# Patient Record
Sex: Female | Born: 1990 | Race: Black or African American | Hispanic: No | Marital: Single | Smoking: Former smoker
Health system: Southern US, Community
[De-identification: ages and names within clinical notes are randomized; demographics above are authoritative.]

## PROBLEM LIST (undated history)

## (undated) DIAGNOSIS — D649 Anemia, unspecified: Secondary | ICD-10-CM

## (undated) HISTORY — PX: NO PAST SURGERIES: SHX2092

---
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
## 2008-09-20 ENCOUNTER — Emergency Department: Payer: Self-pay | Admitting: Emergency Medicine

## 2008-09-27 ENCOUNTER — Emergency Department: Payer: Self-pay | Admitting: Emergency Medicine

## 2008-12-22 ENCOUNTER — Emergency Department: Payer: Self-pay | Admitting: Emergency Medicine

## 2009-09-04 ENCOUNTER — Emergency Department: Payer: Self-pay | Admitting: Emergency Medicine

## 2009-10-29 ENCOUNTER — Emergency Department: Payer: Self-pay

## 2010-02-15 ENCOUNTER — Emergency Department: Payer: Self-pay | Admitting: Emergency Medicine

## 2011-03-30 ENCOUNTER — Emergency Department: Payer: Self-pay | Admitting: Emergency Medicine

## 2013-04-14 ENCOUNTER — Emergency Department: Payer: Self-pay | Admitting: Emergency Medicine

## 2013-04-14 LAB — URINALYSIS, COMPLETE
Ketone: NEGATIVE
Leukocyte Esterase: NEGATIVE
Nitrite: NEGATIVE
Ph: 6 (ref 4.5–8.0)
Protein: NEGATIVE
Specific Gravity: 1.02 (ref 1.003–1.030)

## 2013-04-14 LAB — CBC
MCHC: 35.3 g/dL (ref 32.0–36.0)
MCV: 91 fL (ref 80–100)
Platelet: 347 10*3/uL (ref 150–440)
RBC: 4.47 10*6/uL (ref 3.80–5.20)
RDW: 13.3 % (ref 11.5–14.5)
WBC: 9.7 10*3/uL (ref 3.6–11.0)

## 2013-04-21 ENCOUNTER — Emergency Department: Payer: Self-pay | Admitting: Emergency Medicine

## 2013-04-28 ENCOUNTER — Emergency Department: Payer: Self-pay | Admitting: Internal Medicine

## 2013-10-07 ENCOUNTER — Emergency Department: Payer: Self-pay | Admitting: Internal Medicine

## 2013-12-28 ENCOUNTER — Other Ambulatory Visit (HOSPITAL_COMMUNITY): Payer: Self-pay | Admitting: Orthopedic Surgery

## 2013-12-28 ENCOUNTER — Encounter (HOSPITAL_COMMUNITY): Payer: Self-pay

## 2014-01-03 ENCOUNTER — Encounter (HOSPITAL_COMMUNITY): Payer: Self-pay

## 2014-01-03 ENCOUNTER — Encounter (HOSPITAL_COMMUNITY)
Admission: RE | Admit: 2014-01-03 | Discharge: 2014-01-03 | Disposition: A | Discharge status: Home / Self Care | Payer: Worker's Compensation | Source: Ambulatory Visit | Attending: Orthopedic Surgery | Admitting: Orthopedic Surgery

## 2014-01-03 HISTORY — DX: Anemia, unspecified: D64.9

## 2014-01-03 LAB — CBC
HEMATOCRIT: 42 % (ref 36.0–46.0)
Hemoglobin: 14.4 g/dL (ref 12.0–15.0)
MCH: 31.6 pg (ref 26.0–34.0)
MCHC: 34.3 g/dL (ref 30.0–36.0)
MCV: 92.3 fL (ref 78.0–100.0)
Platelets: 371 10*3/uL (ref 150–400)
RBC: 4.55 MIL/uL (ref 3.87–5.11)
RDW: 13 % (ref 11.5–15.5)
WBC: 8.7 10*3/uL (ref 4.0–10.5)

## 2014-01-03 LAB — COMPREHENSIVE METABOLIC PANEL
ALK PHOS: 63 U/L (ref 39–117)
ALT: 20 U/L (ref 0–35)
AST: 19 U/L (ref 0–37)
Albumin: 3.8 g/dL (ref 3.5–5.2)
BUN: 6 mg/dL (ref 6–23)
CHLORIDE: 104 meq/L (ref 96–112)
CO2: 23 mEq/L (ref 19–32)
Calcium: 9.1 mg/dL (ref 8.4–10.5)
Creatinine, Ser: 0.82 mg/dL (ref 0.50–1.10)
GFR calc Af Amer: >90 mL/min (ref >90)
GFR calc non Af Amer: >90 mL/min (ref >90)
Glucose, Bld: 90 mg/dL (ref 70–99)
POTASSIUM: 3.9 meq/L (ref 3.7–5.3)
SODIUM: 141 meq/L (ref 137–147)
TOTAL PROTEIN: 7.2 g/dL (ref 6.0–8.3)
Total Bilirubin: 0.4 mg/dL (ref 0.3–1.2)

## 2014-01-03 LAB — HCG, SERUM, QUALITATIVE: PREG SERUM: NEGATIVE

## 2014-01-03 MED ORDER — CEFAZOLIN SODIUM-DEXTROSE 2-3 GM-% IV SOLR: 2.0000 g | INTRAVENOUS | Status: DC

## 2014-01-03 NOTE — Pre-Procedure Instructions (Signed)
[place] - Preparing for Surgery  Before surgery, you can play an important role.  Because skin is not sterile, your skin needs to be as free of germs as possible.  You can reduce the number of germs on you skin by washing with CHG (chlorahexidine gluconate) soap before surgery.  CHG is an antiseptic cleaner which kills germs and bonds with the skin to continue killing germs even after washing.  Please DO NOT use if you have an allergy to CHG or antibacterial soaps.  If your skin becomes reddened/irritated stop using the CHG and inform your nurse when you arrive at Short Stay.  Do not shave (including legs and underarms) for at least 48 hours prior to the first CHG shower.  You may shave your face.  Please follow these instructions carefully:   1.  Shower with CHG Soap the night before surgery and the morning of Surgery.  2.  If you choose to wash your hair, wash your hair first as usual with your normal shampoo.  3.  After you shampoo, rinse your hair and body thoroughly to remove the shampoo.  4.  Use CHG as you would any other liquid soap.  You can apply CHG directly to the skin and wash gently with scrungie or a clean washcloth.  5.  Apply the CHG Soap to your body ONLY FROM THE NECK DOWN.  Do not use on open wounds or open sores.  Avoid contact with your eyes, ears, mouth and genitals (private parts).  Wash genitals (private parts) with your normal soap.  6.  Wash thoroughly, paying special attention to the area where your surgery will be performed.  7.  Thoroughly rinse your body with warm water from the neck down.  8.  DO NOT shower/wash with your normal soap after using and rinsing off the CHG Soap.  9.  Pat yourself dry with a clean towel.            10.  Wear clean pajamas.            11.  Place clean sheets on your bed the night of your first shower and do not sleep with pets.  Day of Surgery  Do not apply any lotions the morning of surgery.  Please wear clean clothes to the  hospital/surgery center.   

## 2014-01-03 NOTE — Pre-Procedure Instructions (Signed)
Art Buffmelia Filler  01/03/2014   Your procedure is scheduled on:  April 24  [facility] En[facility] "A" [address] at 08:45 AM.  Call this number if you have problems the morning of surgery: [phone]   Remember:   Do not eat food or drink liquids after midnight.   Take these medicines the morning of surgery with A SIP OF WATER: [place]   STOP/ Do not take Aspirin, Aleve, Naproxen, Advil, Ibuprofen, Vitamin, Herbs, or Supplements starting today   Do not wear jewelry, make-up or nail polish.  Do not wear lotions, powders, or perfumes. You may wear deodorant.  Do not shave 48 hours prior to surgery. Men may shave face and neck.  Do not bring valuables to the hospital.  [facility] is not responsible for any belongings or valuables.               Contacts, dentures or bridgework may not be worn into surgery.  Leave suitcase in the car. After surgery it may be brought to your room.  For patients admitted to the hospital, discharge time is determined by your treatment team.               Patients discharged the day of surgery will not be allowed to drive home.  Name and phone number of your driver: Family/ Friend  Special Instructions: See [facility] Pre[facility] For [facility]   Please read over the following fact sheets that you were given: Pain Booklet, Coughing and Deep Breathing and Surgical Site Infection Prevention

## 2014-01-04 ENCOUNTER — Encounter (HOSPITAL_COMMUNITY)
Admission: RE | Disposition: A | Discharge status: Home / Self Care | Payer: Self-pay | Source: Ambulatory Visit | Attending: Orthopedic Surgery

## 2014-01-04 ENCOUNTER — Ambulatory Visit (HOSPITAL_COMMUNITY): Payer: Worker's Compensation | Admitting: Certified Registered"

## 2014-01-04 ENCOUNTER — Ambulatory Visit (HOSPITAL_COMMUNITY)
Admission: RE | Admit: 2014-01-04 | Discharge: 2014-01-05 | Disposition: A | Discharge status: Home / Self Care | Payer: Worker's Compensation | Source: Ambulatory Visit | Attending: Orthopedic Surgery | Admitting: Orthopedic Surgery

## 2014-01-04 ENCOUNTER — Encounter (HOSPITAL_COMMUNITY): Payer: Self-pay | Admitting: *Deleted

## 2014-01-04 ENCOUNTER — Encounter (HOSPITAL_COMMUNITY): Payer: Worker's Compensation | Admitting: Certified Registered"

## 2014-01-04 DIAGNOSIS — S8290XS Unspecified fracture of unspecified lower leg, sequela: Secondary | ICD-10-CM | POA: Insufficient documentation

## 2014-01-04 DIAGNOSIS — X58XXXS Exposure to other specified factors, sequela: Secondary | ICD-10-CM | POA: Insufficient documentation

## 2014-01-04 DIAGNOSIS — Z87891 Personal history of nicotine dependence: Secondary | ICD-10-CM | POA: Insufficient documentation

## 2014-01-04 DIAGNOSIS — IMO0002 Reserved for concepts with insufficient information to code with codable children: Secondary | ICD-10-CM | POA: Diagnosis present

## 2014-01-04 HISTORY — PX: ORIF ANKLE FRACTURE: SHX5408

## 2014-01-04 SURGERY — OPEN REDUCTION INTERNAL FIXATION (ORIF) ANKLE FRACTURE
Anesthesia: Regional | Site: Ankle | Laterality: Right

## 2014-01-04 MED ORDER — FENTANYL CITRATE 0.05 MG/ML IJ SOLN
INTRAMUSCULAR | Status: DC | PRN
  Administered 2014-01-04: 50 ug via INTRAVENOUS
  Administered 2014-01-04: 100 ug via INTRAVENOUS
  Administered 2014-01-04: 50 ug via INTRAVENOUS

## 2014-01-04 MED ORDER — ONDANSETRON HCL 4 MG PO TABS: 4.0000 mg | ORAL_TABLET | Freq: Four times a day (QID) | ORAL | Status: DC | PRN

## 2014-01-04 MED ORDER — ONDANSETRON HCL 4 MG/2ML IJ SOLN: 4.0000 mg | Freq: Four times a day (QID) | INTRAMUSCULAR | Status: DC | PRN

## 2014-01-04 MED ORDER — LACTATED RINGERS IV SOLN
INTRAVENOUS | Status: DC
  Administered 2014-01-04: 09:00:00 via INTRAVENOUS

## 2014-01-04 MED ORDER — PROMETHAZINE HCL 25 MG/ML IJ SOLN: 6.2500 mg | INTRAMUSCULAR | Status: DC | PRN

## 2014-01-04 MED ORDER — ACETAMINOPHEN 160 MG/5ML PO SOLN
325.0000 mg | ORAL | Status: DC | PRN
  Filled 2014-01-04: qty 20.3

## 2014-01-04 MED ORDER — METOCLOPRAMIDE HCL 5 MG/ML IJ SOLN: 5.0000 mg | Freq: Three times a day (TID) | INTRAMUSCULAR | Status: DC | PRN

## 2014-01-04 MED ORDER — 0.9 % SODIUM CHLORIDE (POUR BTL) OPTIME
TOPICAL | Status: DC | PRN
  Administered 2014-01-04: 1000 mL

## 2014-01-04 MED ORDER — DEXTROSE 5 % IV SOLN
500.0000 mg | Freq: Four times a day (QID) | INTRAVENOUS | Status: DC | PRN
  Filled 2014-01-04: qty 5

## 2014-01-04 MED ORDER — CEFAZOLIN SODIUM 1-5 GM-% IV SOLN
1.0000 g | Freq: Four times a day (QID) | INTRAVENOUS | Status: AC
  Administered 2014-01-04 – 2014-01-05 (×3): 1 g via INTRAVENOUS
  Filled 2014-01-04 (×5): qty 50

## 2014-01-04 MED ORDER — MIDAZOLAM HCL 5 MG/5ML IJ SOLN
INTRAMUSCULAR | Status: DC | PRN
  Administered 2014-01-04: 2 mg via INTRAVENOUS

## 2014-01-04 MED ORDER — CEFAZOLIN SODIUM-DEXTROSE 2-3 GM-% IV SOLR
INTRAVENOUS | Status: AC
  Administered 2014-01-04: 2 g via INTRAVENOUS
  Filled 2014-01-04: qty 50

## 2014-01-04 MED ORDER — FENTANYL CITRATE 0.05 MG/ML IJ SOLN
INTRAMUSCULAR | Status: AC
  Filled 2014-01-04: qty 5

## 2014-01-04 MED ORDER — ONDANSETRON HCL 4 MG/2ML IJ SOLN
INTRAMUSCULAR | Status: DC | PRN
  Administered 2014-01-04: 4 mg via INTRAVENOUS

## 2014-01-04 MED ORDER — ASPIRIN EC 325 MG PO TBEC
325.0000 mg | DELAYED_RELEASE_TABLET | Freq: Every day | ORAL | Status: DC
  Administered 2014-01-04 – 2014-01-05 (×2): 325 mg via ORAL
  Filled 2014-01-04 (×2): qty 1

## 2014-01-04 MED ORDER — HYDROMORPHONE HCL PF 1 MG/ML IJ SOLN: 0.2500 mg | INTRAMUSCULAR | Status: DC | PRN

## 2014-01-04 MED ORDER — METHOCARBAMOL 500 MG PO TABS
500.0000 mg | ORAL_TABLET | Freq: Four times a day (QID) | ORAL | Status: DC | PRN
  Administered 2014-01-05 (×3): 500 mg via ORAL
  Filled 2014-01-04 (×3): qty 1

## 2014-01-04 MED ORDER — ESTRADIOL VALERATE-DIENOGEST 3/2-2/2-3/1 MG PO TABS: 1.0000 | ORAL_TABLET | Freq: Every day | ORAL | Status: DC

## 2014-01-04 MED ORDER — OXYCODONE-ACETAMINOPHEN 5-325 MG PO TABS
1.0000 | ORAL_TABLET | ORAL | Status: DC | PRN
  Administered 2014-01-04 – 2014-01-05 (×5): 2 via ORAL
  Filled 2014-01-04 (×5): qty 2

## 2014-01-04 MED ORDER — METOCLOPRAMIDE HCL 10 MG PO TABS: 5.0000 mg | ORAL_TABLET | Freq: Three times a day (TID) | ORAL | Status: DC | PRN

## 2014-01-04 MED ORDER — DEXAMETHASONE SODIUM PHOSPHATE 10 MG/ML IJ SOLN
INTRAMUSCULAR | Status: DC | PRN
  Administered 2014-01-04: 4 mg via INTRAVENOUS

## 2014-01-04 MED ORDER — FENTANYL CITRATE 0.05 MG/ML IJ SOLN
INTRAMUSCULAR | Status: AC
  Filled 2014-01-04: qty 2

## 2014-01-04 MED ORDER — BUPIVACAINE-EPINEPHRINE PF 0.5-1:200000 % IJ SOLN
INTRAMUSCULAR | Status: DC | PRN
  Administered 2014-01-04: 10 mL via PERINEURAL

## 2014-01-04 MED ORDER — LACTATED RINGERS IV SOLN
INTRAVENOUS | Status: DC | PRN
  Administered 2014-01-04: 10:00:00 via INTRAVENOUS

## 2014-01-04 MED ORDER — HYDROMORPHONE HCL PF 1 MG/ML IJ SOLN: 0.5000 mg | INTRAMUSCULAR | Status: DC | PRN

## 2014-01-04 MED ORDER — OXYCODONE-ACETAMINOPHEN 5-325 MG PO TABS: 1.0000 | ORAL_TABLET | ORAL | Status: DC | PRN

## 2014-01-04 MED ORDER — MIDAZOLAM HCL 2 MG/2ML IJ SOLN
INTRAMUSCULAR | Status: AC
  Filled 2014-01-04: qty 2

## 2014-01-04 MED ORDER — PROPOFOL 10 MG/ML IV BOLUS
INTRAVENOUS | Status: AC
  Filled 2014-01-04: qty 20

## 2014-01-04 MED ORDER — LIDOCAINE HCL (CARDIAC) 20 MG/ML IV SOLN
INTRAVENOUS | Status: DC | PRN
  Administered 2014-01-04: 80 mg via INTRAVENOUS

## 2014-01-04 MED ORDER — ACETAMINOPHEN 325 MG PO TABS: 325.0000 mg | ORAL_TABLET | ORAL | Status: DC | PRN

## 2014-01-04 MED ORDER — KETOROLAC TROMETHAMINE 30 MG/ML IJ SOLN: 15.0000 mg | Freq: Once | INTRAMUSCULAR | Status: DC | PRN

## 2014-01-04 MED ORDER — ROPIVACAINE HCL 5 MG/ML IJ SOLN
INTRAMUSCULAR | Status: DC | PRN
  Administered 2014-01-04: 30 mL via PERINEURAL

## 2014-01-04 MED ORDER — PROPOFOL 10 MG/ML IV BOLUS
INTRAVENOUS | Status: DC | PRN
  Administered 2014-01-04: 200 mg via INTRAVENOUS

## 2014-01-04 MED ORDER — OXYCODONE HCL 5 MG PO TABS: 5.0000 mg | ORAL_TABLET | Freq: Once | ORAL | Status: DC | PRN

## 2014-01-04 MED ORDER — OXYCODONE HCL 5 MG/5ML PO SOLN: 5.0000 mg | Freq: Once | ORAL | Status: DC | PRN

## 2014-01-04 SURGICAL SUPPLY — 51 items
BANDAGE ESMARK 6X9 LF (GAUZE/BANDAGES/DRESSINGS) IMPLANT
BIT DRILL 2.5X110 QC LCP DISP (BIT) ×3 IMPLANT
BNDG COHESIVE 4X5 TAN STRL (GAUZE/BANDAGES/DRESSINGS) ×3 IMPLANT
BNDG ESMARK 6X9 LF (GAUZE/BANDAGES/DRESSINGS)
BNDG GAUZE STRTCH 6 (GAUZE/BANDAGES/DRESSINGS) ×3 IMPLANT
COVER SURGICAL LIGHT HANDLE (MISCELLANEOUS) ×3 IMPLANT
CUFF TOURNIQUET SINGLE 34IN LL (TOURNIQUET CUFF) IMPLANT
CUFF TOURNIQUET SINGLE 44IN (TOURNIQUET CUFF) IMPLANT
DRAPE C-ARM MINI 42X72 WSTRAPS (DRAPES) ×3 IMPLANT
DRAPE INCISE IOBAN 66X45 STRL (DRAPES) ×3 IMPLANT
DRAPE PROXIMA HALF (DRAPES) ×3 IMPLANT
DRAPE U-SHAPE 47X51 STRL (DRAPES) ×3 IMPLANT
DRSG ADAPTIC 3X8 NADH LF (GAUZE/BANDAGES/DRESSINGS) ×3 IMPLANT
DURAPREP 26ML APPLICATOR (WOUND CARE) ×3 IMPLANT
ELECT CAUTERY BLADE 6.4 (BLADE) ×3 IMPLANT
ELECT REM PT RETURN 9FT ADLT (ELECTROSURGICAL) ×3
ELECTRODE REM PT RTRN 9FT ADLT (ELECTROSURGICAL) ×1 IMPLANT
GLOVE BIO SURGEON STRL SZ7.5 (GLOVE) ×3 IMPLANT
GLOVE BIOGEL PI IND STRL 7.0 (GLOVE) ×1 IMPLANT
GLOVE BIOGEL PI IND STRL 7.5 (GLOVE) ×2 IMPLANT
GLOVE BIOGEL PI IND STRL 9 (GLOVE) ×1 IMPLANT
GLOVE BIOGEL PI INDICATOR 7.0 (GLOVE) ×2
GLOVE BIOGEL PI INDICATOR 7.5 (GLOVE) ×4
GLOVE BIOGEL PI INDICATOR 9 (GLOVE) ×2
GLOVE ECLIPSE 7.0 STRL STRAW (GLOVE) ×3 IMPLANT
GLOVE SS BIOGEL STRL SZ 6.5 (GLOVE) ×1 IMPLANT
GLOVE SUPERSENSE BIOGEL SZ 6.5 (GLOVE) ×2
GLOVE SURG ORTHO 9.0 STRL STRW (GLOVE) ×3 IMPLANT
GOWN STRL REUS W/ TWL XL LVL3 (GOWN DISPOSABLE) ×1 IMPLANT
GOWN STRL REUS W/TWL XL LVL3 (GOWN DISPOSABLE) ×2
KIT BASIN OR (CUSTOM PROCEDURE TRAY) ×3 IMPLANT
KIT ROOM TURNOVER OR (KITS) ×3 IMPLANT
MANIFOLD NEPTUNE II (INSTRUMENTS) ×3 IMPLANT
NS IRRIG 1000ML POUR BTL (IV SOLUTION) ×3 IMPLANT
PACK ORTHO EXTREMITY (CUSTOM PROCEDURE TRAY) ×3 IMPLANT
PAD ABD 8X10 STRL (GAUZE/BANDAGES/DRESSINGS) ×6 IMPLANT
PAD ARMBOARD 7.5X6 YLW CONV (MISCELLANEOUS) ×6 IMPLANT
PLATE LCP 3.5 1/3 TUB 6HX69 (Plate) ×3 IMPLANT
SCREW CORTEX 3.5 12MM (Screw) ×2 IMPLANT
SCREW CORTEX 3.5 14MM (Screw) ×4 IMPLANT
SCREW CORTEX 3.5 18MM (Screw) ×6 IMPLANT
SCREW LOCK CORT ST 3.5X12 (Screw) ×1 IMPLANT
SCREW LOCK CORT ST 3.5X14 (Screw) ×2 IMPLANT
SCREW LOCK CORT ST 3.5X18 (Screw) ×3 IMPLANT
SPONGE GAUZE 4X4 12PLY (GAUZE/BANDAGES/DRESSINGS) ×3 IMPLANT
SPONGE LAP 18X18 X RAY DECT (DISPOSABLE) ×3 IMPLANT
SUCTION FRAZIER TIP 10 FR DISP (SUCTIONS) ×3 IMPLANT
SUT VIC AB 2-0 CTB1 (SUTURE) ×6 IMPLANT
TOWEL OR 17X26 10 PK STRL BLUE (TOWEL DISPOSABLE) ×3 IMPLANT
TUBE CONNECTING 12'X1/4 (SUCTIONS) ×1
TUBE CONNECTING 12X1/4 (SUCTIONS) ×2 IMPLANT

## 2014-01-04 NOTE — Transfer of Care (Signed)
Immediate Anesthesia Transfer of Care Note  Patient: Morgan Lopez  Procedure(s) Performed: Procedure(s) with comments: OPEN REDUCTION INTERNAL FIXATION (ORIF) ANKLE FRACTURE (Right) - Open Reduction Internal Fixation Right Fibula Fracture and Take Down Non-Union   Patient Location: PACU  Anesthesia Type:General  Level of Consciousness: awake, alert  and oriented  Airway & Oxygen Therapy: Patient connected to face mask oxygen  Post-op Assessment: Report given to PACU RN  Post vital signs: stable  Complications: No apparent anesthesia complications

## 2014-01-04 NOTE — H&P (Signed)
Morgan Lopez is an 24 y.o. female.   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  Past Medical History  Diagnosis Date  . Anemia     Past Surgical History  Procedure Laterality Date  . No past surgeries      No family history on file. Social History:  reports that she quit smoking about 6 weeks ago. She has never used smokeless tobacco. She reports that she does not drink alcohol or use illicit drugs.  Allergies: Not on File  No prescriptions prior to admission    Results for orders placed during the hospital encounter of 01/03/14 (from the past 48 hour(s))  CBC     Status: None   Collection Time    01/03/14 10:30 AM      Result Value Ref Range   WBC 8.7  4.0 - 10.5 K/uL   RBC 4.55  3.87 - 5.11 MIL/uL   Hemoglobin 14.4  12.0 - 15.0 g/dL   HCT 42.0  36.0 - 46.0 %   MCV 92.3  78.0 - 100.0 fL   MCH 31.6  26.0 - 34.0 pg   MCHC 34.3  30.0 - 36.0 g/dL   RDW 13.0  11.5 - 15.5 %   Platelets 371  150 - 400 K/uL  COMPREHENSIVE METABOLIC PANEL     Status: None   Collection Time    01/03/14 10:30 AM      Result Value Ref Range   Sodium 141  137 - 147 mEq/L   Potassium 3.9  3.7 - 5.3 mEq/L   Chloride 104  96 - 112 mEq/L   CO2 23  19 - 32 mEq/L   Glucose, Bld 90  70 - 99 mg/dL   BUN 6  6 - 23 mg/dL   Creatinine, Ser 0.82  0.50 - 1.10 mg/dL   Calcium 9.1  8.4 - 10.5 mg/dL   Total Protein 7.2  6.0 - 8.3 g/dL   Albumin 3.8  3.5 - 5.2 g/dL   AST 19  0 - 37 U/L   ALT 20  0 - 35 U/L   Alkaline Phosphatase 63  39 - 117 U/L   Total Bilirubin 0.4  0.3 - 1.2 mg/dL   GFR calc non Af Amer >90  >90 mL/min   GFR calc Af Amer >90  >90 mL/min   Comment: (NOTE)     The eGFR has been calculated using the CKD EPI equation.     This calculation has not been validated in all clinical situations.     eGFR's persistently <90 mL/min signify possible Chronic Kidney     Disease.  HCG, SERUM, QUALITATIVE      Status: None   Collection Time    01/03/14 10:30 AM      Result Value Ref Range   Preg, Serum NEGATIVE  NEGATIVE   Comment:            THE SENSITIVITY OF THIS     METHODOLOGY IS >10 mIU/mL.   No results found.  Review of Systems  All other systems reviewed and are negative.   There were no vitals taken for this visit. Physical Exam  On examination Morgan Lopez has palpable pulses. She has pain to palpation over the fibula and CT scan shows a nonunion. 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  Plan: Will plan to take down the nonunion open reduction internal fixation. Risks  and benefits were discussed including infection neurovascular injury pain persistent nonunion. Morgan Lopez states she understands and wished to proceed at this time.  Newt Minion 01/04/2014, 6:29 AM

## 2014-01-04 NOTE — Anesthesia Preprocedure Evaluation (Addendum)
Anesthesia Evaluation  Patient identified by MRN, date of birth, ID band Patient awake    Reviewed: Allergy & Precautions, H&P , NPO status , Patient's Chart, lab work & pertinent test results, reviewed documented beta blocker date and time   History of Anesthesia Complications Negative for: history of anesthetic complications  Airway Mallampati: II TM Distance: >3 FB Neck ROM: Full    Dental  (+) Teeth Intact   Pulmonary neg sleep apnea, neg COPDformer smoker,  breath sounds clear to auscultation        Cardiovascular negative cardio ROS  Rhythm:Regular     Neuro/Psych negative neurological ROS  negative psych ROS   GI/Hepatic negative GI ROS, Neg liver ROS,   Endo/Other  negative endocrine ROS  Renal/GU negative Renal ROS     Musculoskeletal negative musculoskeletal ROS (+)   Abdominal   Peds  Hematology negative hematology ROS (+)   Anesthesia Other Findings   Reproductive/Obstetrics Birth control pills                        Anesthesia Physical Anesthesia Plan  ASA: I  Anesthesia Plan: General and Regional   Post-op Pain Management:    Induction: Intravenous  Airway Management Planned: LMA  Additional Equipment: None  Intra-op Plan:   Post-operative Plan: Extubation in OR  Informed Consent: I have reviewed the patients History and Physical, chart, labs and discussed the procedure including the risks, benefits and alternatives for the proposed anesthesia with the patient or authorized representative who has indicated his/her understanding and acceptance.   Dental advisory given  Plan Discussed with: CRNA and Surgeon  Anesthesia Plan Comments:         Anesthesia Quick Evaluation

## 2014-01-04 NOTE — Anesthesia Postprocedure Evaluation (Signed)
  Anesthesia Post-op Note  Patient: Morgan Lopez  Procedure(s) Performed: Procedure(s) with comments: OPEN REDUCTION INTERNAL FIXATION (ORIF) ANKLE FRACTURE (Right) - Open Reduction Internal Fixation Right Fibula Fracture and Take Down Non-Union   Patient Location: PACU  Anesthesia Type:General and Regional  Level of Consciousness: awake, alert  and oriented  Airway and Oxygen Therapy: Patient Spontanous Breathing  Post-op Pain: none  Post-op Assessment: Post-op Vital signs reviewed, Patient's Cardiovascular Status Stable, Respiratory Function Stable, Patent Airway, No signs of Nausea or vomiting and Pain level controlled  Post-op Vital Signs: Reviewed and stable  Last Vitals:  Filed Vitals:   01/04/14 1215  BP: 133/81  Pulse:   Temp:   Resp:     Complications: No apparent anesthesia complications

## 2014-01-04 NOTE — Progress Notes (Signed)
Orthopedic Tech Progress Note Patient Details:  Morgan Lopez 1991/01/10 [id number]  Ortho Devices Type of Ortho Device: CAM walker Ortho Device/Splint Interventions: Application   Mickie BailJennifer Carol Cammer 01/04/2014, 2:22 PM

## 2014-01-04 NOTE — Anesthesia Procedure Notes (Addendum)
Anesthesia Regional Block:  Popliteal block  Pre-Anesthetic Checklist: ,, timeout performed, Correct Patient, Correct Site, Correct Laterality, Correct Procedure, Correct Position, site marked, Risks and benefits discussed,  Surgical consent,  Pre-op evaluation,  At surgeon's request and post-op pain management  Laterality: Lower and Right  Prep: chloraprep       Needles:  Injection technique: Single-shot  Needle Type: Echogenic Stimulator Needle          Additional Needles:  Procedures: ultrasound guided (picture in chart) Popliteal block  Nerve Stimulator or Paresthesia:  Response: eversion, 0.4 mA,   Additional Responses:   Narrative:  Start time: 01/04/2014 10:14 AM End time: 01/04/2014 10:25 AM Injection made incrementally with aspirations every 5 mL.  Performed by: Personally  Anesthesiologist: Moser  Additional Notes: H+P and labs reviewed, risks and benefits discussed with patient, procedure tolerated well without complications. Right popliteal ss block with US guidance performed as well    Procedure Name: LMA Insertion Date/Time: 01/04/2014 11:01 AM Performed by: Ellin GoodieWEAVER, Allexis Bordenave M Pre-anesthesia Checklist: Patient identified, Emergency Drugs available, Suction available, Patient being monitored and Timeout performed Patient Re-evaluated:Patient Re-evaluated prior to inductionOxygen Delivery Method: Circle system utilized Preoxygenation: Pre-oxygenation with 100% oxygen Intubation Type: IV induction Ventilation: Mask ventilation without difficulty LMA: LMA inserted LMA Size: 4.0 Number of attempts: 1 Placement Confirmation: breath sounds checked- equal and bilateral and positive ETCO2 Tube secured with: Tape Dental Injury: Teeth and Oropharynx as per pre-operative assessment  Comments: Easy induction and LMA insertion.  Dr Maple HudsonMoser verified placement.  Carlynn HeraldH Weaver, CRNA

## 2014-01-04 NOTE — Op Note (Signed)
OPERATIVE REPORT  DATE OF SURGERY: 01/04/2014  PATIENT:  Morgan Lopez,  23 y.o. 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 She has failed over 7 months of conservative care and presents at this time for take down of the malunion. Risks and benefits were discussed including infection neurovascular injury persistent pain need for additional surgery. Patient states she understands and wished to proceed at this time. Description of procedure patient was brought to the operating room and underwent a general anesthetic. After adequate levels and anesthesia were obtained patient's right lower extremity was prepped using DuraPrep draped into a sterile field. A timeout was called. A lateral incision was made over the fibula. This was carried sharply down to bone. The nonunion site was identified this wasn't debrided with osteotomes back to bleeding viable cancellus bone. There is no signs of infection. After debridement a one third tubular plate was placed laterally and secured with compression screws. C-arm fluoroscopy verified alignment and reduction. The wound is irrigated with normal saline subcutaneous is closed using 0 Vicryl skin was closed using staples. The wound was covered with a sterile dressing with compression Coban wrap patient was extubated taken to the PACU in stable condition plan for overnight observation.  PLAN OF CARE: Admit for overnight observation  PATIENT DISPOSITION:  PACU - hemodynamically stable.   Nadara MustardMarcus V Antavius Sperbeck, MD 01/04/2014 11:33 AM

## 2014-01-05 NOTE — Care Management Note (Signed)
CARE MANAGEMENT NOTE 01/05/2014  Patient:  Art BuffBURKE,Candis   Account Number:  [id number]  Date Initiated:  01/05/2014  Documentation initiated by:  Vance PeperBRADY,Farrin Shadle  Subjective/Objective Assessment:   23 yr old female admitted with non union of right ankle fracture,s/p ORIF of right ankle.     Action/Plan:   Case manager spoke with patient concerning DME needs. Patient is under worker's comp. Adjuster is Maximino SarinMichael Malone [phone]. CM called and left message, will fax order on Monday.   Anticipated DC Date:  01/05/2014   Anticipated DC Plan:  HOME/SELF CARE      DC Planning Services  CM consult      PAC Choice  DURABLE MEDICAL EQUIPMENT   Choice offered to / List presented to:     DME arranged  [employer]      DME agency  Other     HH arranged  NA      Status of service:  In process, will continue to follow Medicare Important Message given?   (If response is "NO", the following Medicare IM given date fields will be blank) Date Medicare IM given:   Date Additional Medicare IM given:    Discharge Disposition:  HOME/SELF CARE

## 2014-01-05 NOTE — Progress Notes (Signed)
Call to Dr Lajoyce Cornersuda or associate re; poor pain control with robaxin q6h and 2 percocet q3h

## 2014-01-05 NOTE — Evaluation (Signed)
Physical Therapy Evaluation Patient Details Name: Merryn Thaker MRN: [id number] DOB: 21-Jun-1991 Today's Date: 01/05/2014   History of Present Illness  Pt presents for ORIF right ankle after malunion of previous fracture.  Clinical Impression  Pt mobilizing well, felt safer with TDWB with RW than crutches, recommend for home. Recommend OP PT when appropriate. PT signing off at this time for d/c, pt safe with mobility with RW.     Follow Up Recommendations Outpatient PT    Equipment Recommendations  Rolling walker with 5" wheels    Recommendations for Other Services       Precautions / Restrictions Precautions Precautions: None Required Braces or Orthoses: Other Brace/Splint Other Brace/Splint: right CAM boot Restrictions Weight Bearing Restrictions: Yes RLE Weight Bearing: Touchdown weight bearing      Mobility  Bed Mobility Overal bed mobility: Independent                Transfers Overall transfer level: Modified independent Equipment used: Rolling walker (2 wheeled)             General transfer comment: safe standing and sitting to RW  Ambulation/Gait Ambulation/Gait assistance: Modified independent (Device/Increase time) Ambulation Distance (Feet): 20 Feet Assistive device: Rolling walker (2 wheeled) Gait Pattern/deviations: Step-to pattern Gait velocity: decreased   General Gait Details: pt felt safer and more steady with RW than she has with crutches. Recommend for home  Stairs            Wheelchair Mobility    Modified Rankin (Stroke Patients Only)       Balance Overall balance assessment: Needs assistance         Standing balance support: No upper extremity supported;During functional activity Standing balance-Leahy Scale: Fair Standing balance comment: unsteady when keeping TDWB and no UE support                             Pertinent Vitals/Pain VSS    Home Living Family/patient expects to be discharged to::  Private residence Living Arrangements: Children;Other relatives Available Help at Discharge: Family;Available 24 hours/day Type of Home: House Home Access: Stairs to enter   Technical brewer of Steps: 3 Home Layout: One level Home Equipment: Crutches Additional Comments: pt lives with her 60 yo son and her grandmother    Prior Function Level of Independence: Independent         Comments: pt was independent except for driving due to pain and dysfunction that continued at thr right ankle as well as right hip and back from initial injury     Hand Dominance        Extremity/Trunk Assessment   Upper Extremity Assessment: Overall WFL for tasks assessed           Lower Extremity Assessment: RLE deficits/detail RLE Deficits / Details: no ROM right ankle due to CAM boot, painful right hip, knee WFL    Cervical / Trunk Assessment: Normal  Communication   Communication: No difficulties  Cognition Arousal/Alertness: Awake/alert Behavior During Therapy: WFL for tasks assessed/performed Overall Cognitive Status: Within Functional Limits for tasks assessed                      General Comments      Exercises        Assessment/Plan    PT Assessment All further PT needs can be met in the next venue of care  PT Diagnosis Difficulty walking;Acute pain   PT Problem List  Decreased balance;Decreased mobility;Decreased knowledge of use of DME;Decreased knowledge of precautions;Pain  PT Treatment Interventions     PT Goals (Current goals can be found in the Care Plan section) Acute Rehab PT Goals Patient Stated Goal: return home PT Goal Formulation: No goals set, d/c therapy    Frequency     Barriers to discharge        Co-evaluation               End of Session Equipment Utilized During Treatment: Gait belt Activity Tolerance: Patient tolerated treatment well Patient left: in chair;with call bell/phone within reach Nurse Communication: Mobility  status    Functional Assessment Tool Used: clinical judgement Functional Limitation: Mobility: Walking and moving around Mobility: Walking and Moving Around Current Status (I2202): At least 1 percent but less than 20 percent impaired, limited or restricted Mobility: Walking and Moving Around Goal Status [phone]): At least 1 percent but less than 20 percent impaired, limited or restricted Mobility: Walking and Moving Around Discharge Status [phone]): At least 1 percent but less than 20 percent impaired, limited or restricted    Time: 1121-1140 PT Time Calculation (min): 19 min   Charges:   PT Evaluation $Initial PT Evaluation Tier I: 1 Procedure PT Treatments $Gait Training: 8-22 mins   PT G Codes:   Functional Assessment Tool Used: clinical judgement Functional Limitation: Mobility: Walking and moving around  [employer], PT  Acute Rehab Services  [place] 01/05/2014, 1:11 PM

## 2014-01-05 NOTE — Progress Notes (Signed)
Written and verbal d/c instructions provided to patient and family. Prescription given for narcotic analgesic. Pt to call Dr Lajoyce Cornersuda this Monday for concerns r/t need for crutches( PT clear for safer use with walker), and robaxin prescription. D/c to home in wheelchair with staff escort to car.

## 2014-01-07 NOTE — Discharge Summary (Signed)
  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 Takedown nonunion and open reduction internal fixation of the fracture. Discharge to home in stable condition. Followup in the office in 2 weeks.

## 2014-01-08 ENCOUNTER — Encounter (HOSPITAL_COMMUNITY): Payer: Self-pay | Admitting: Orthopedic Surgery

## 2014-09-12 IMAGING — US US PELV - US TRANSVAGINAL
2 series · 14 of 25 positions shown · non-contrast
Comparison: none

REASON FOR EXAM: vaginal bleeding pelvic pain
COMMENTS:

PROCEDURE:     US  - US PELVIS EXAM W/TRANSVAGINAL  - April 14, 2013  [DATE]
RESULT:     Pelvic ultrasound dated 04/14/2013.
TECHNIQUE: Endovaginal and transabdominal imaging of the pelvis was obtained.

[Series 1: us pelv - us transvaginal · 0.25mm/px · 13 of 57 slices shown (1 of 2)]
[im 1/57]
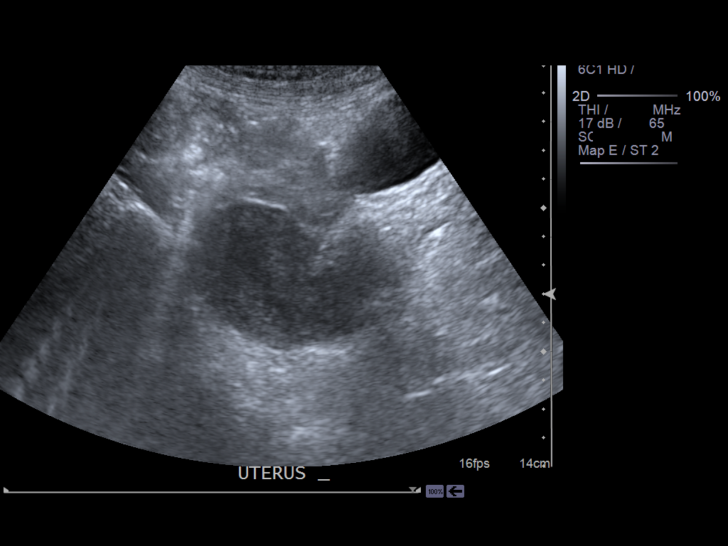
[im 5/57]
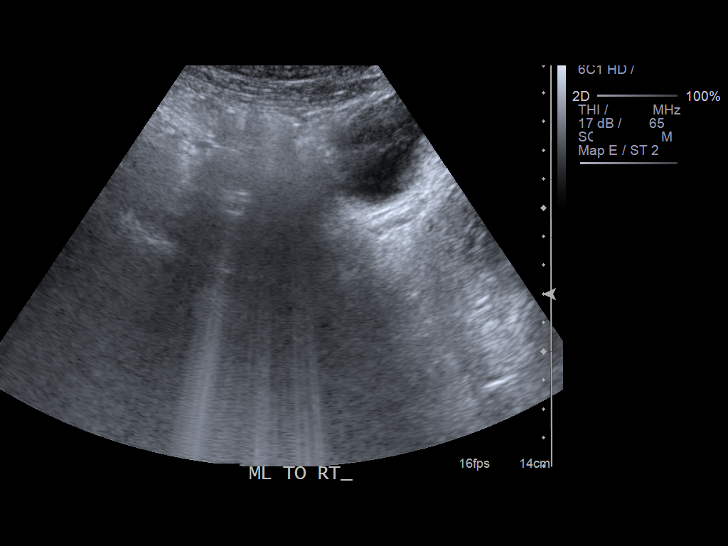
[im 10/57]
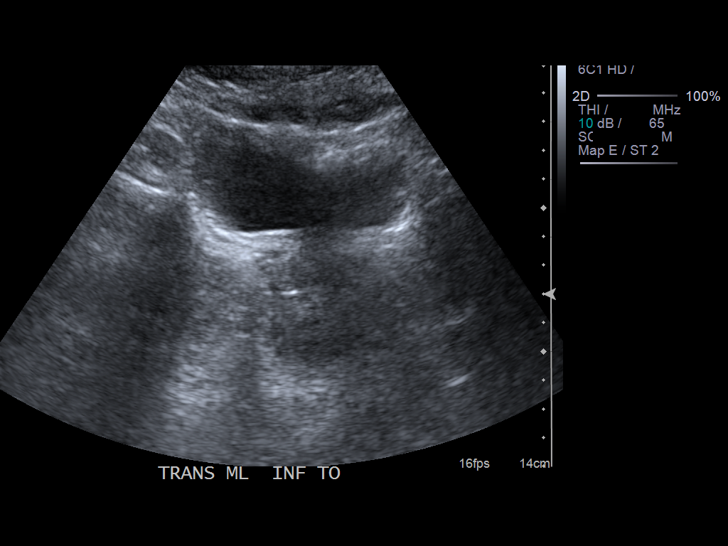
[im 15/57]
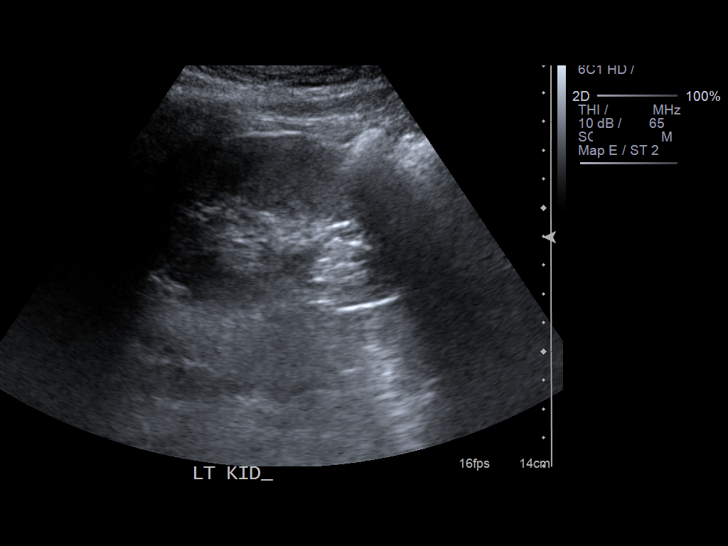
[im 20/57]
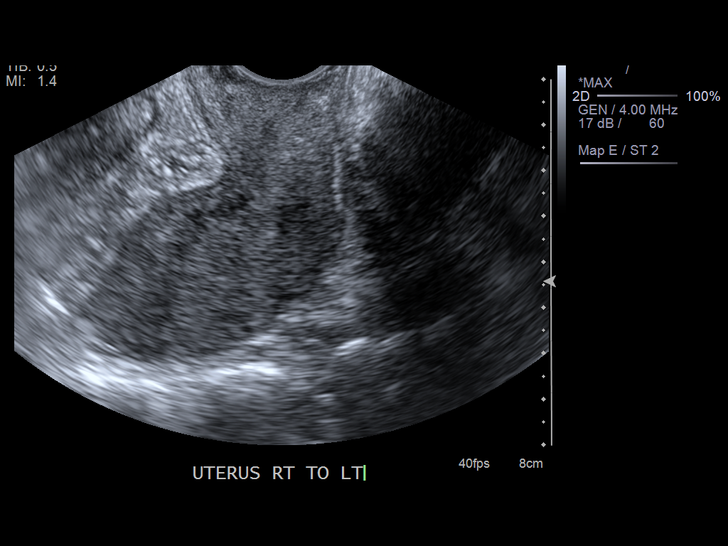
[im 22/57]
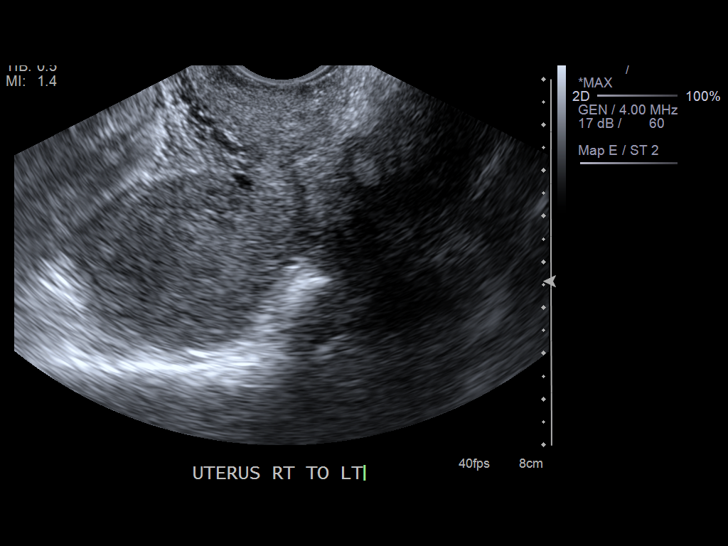
[im 27/57]
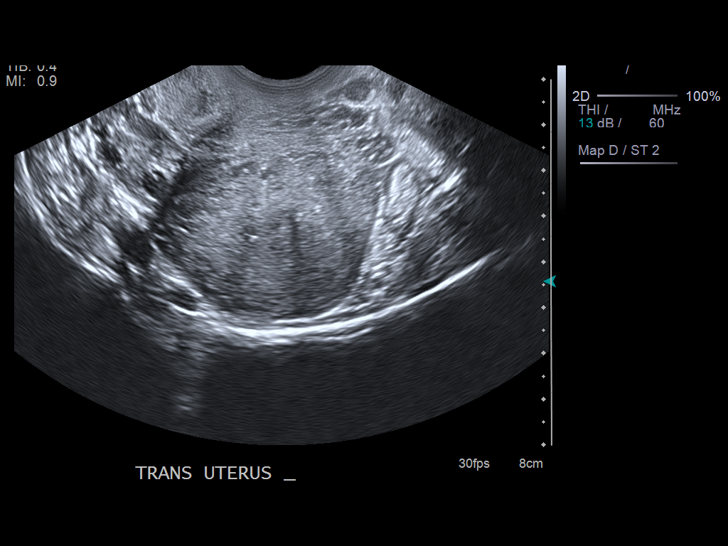
[im 32/57]
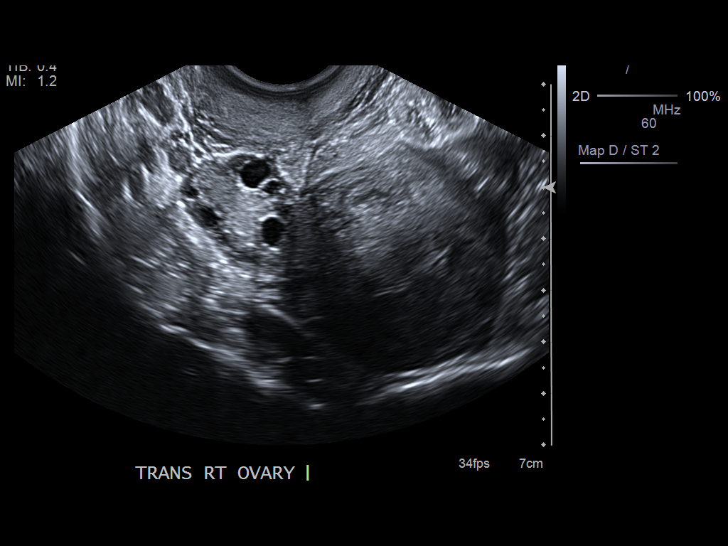
[im 37/57]
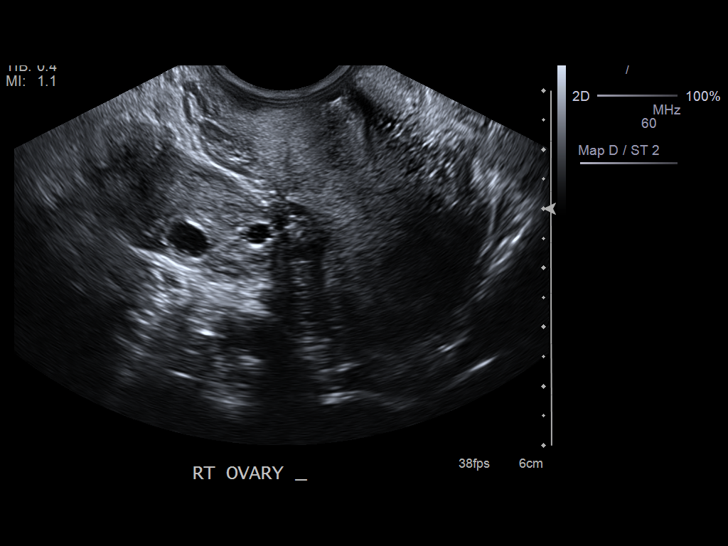
[im 39/57]
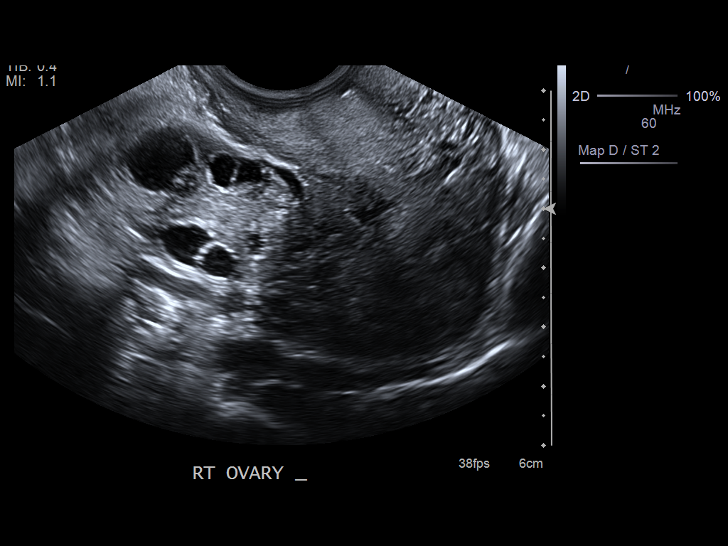
[im 44/57]
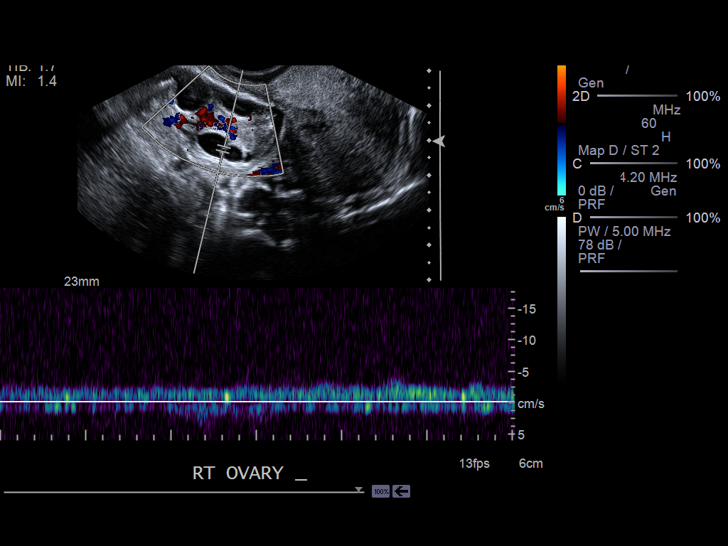
[im 49/57]
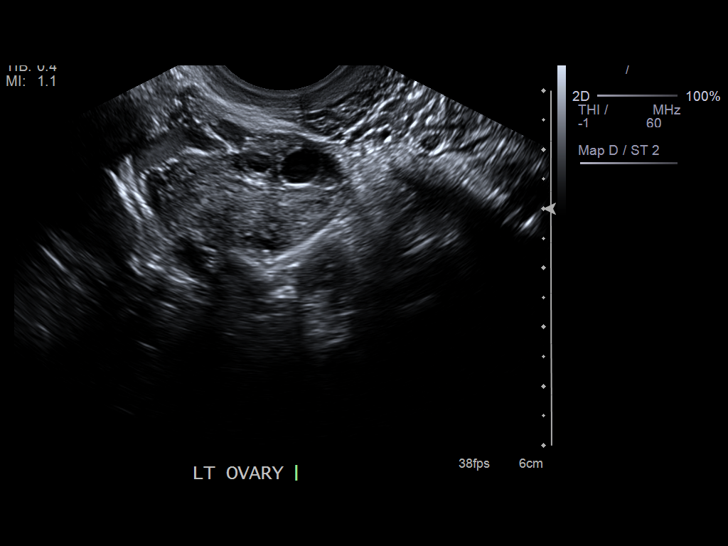
[im 54/57]
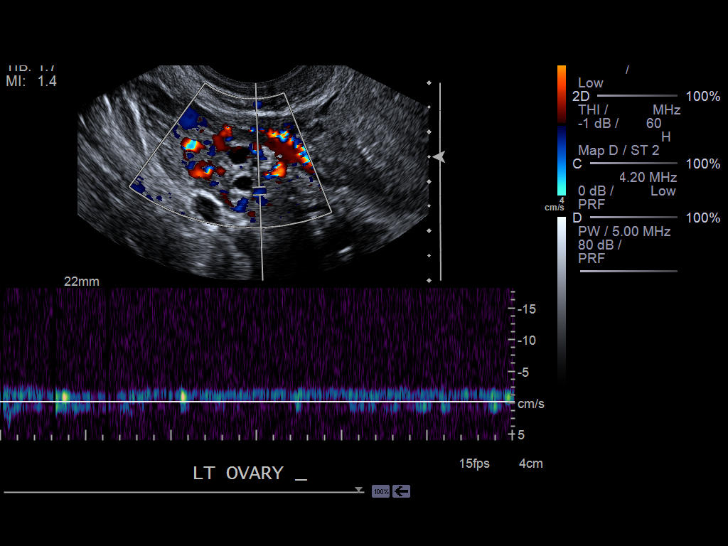

[Series 1001: us pelv - us transvaginal · 0.10mm/px · 1 of 1 slices shown (2 of 2)]
[im 1/1]
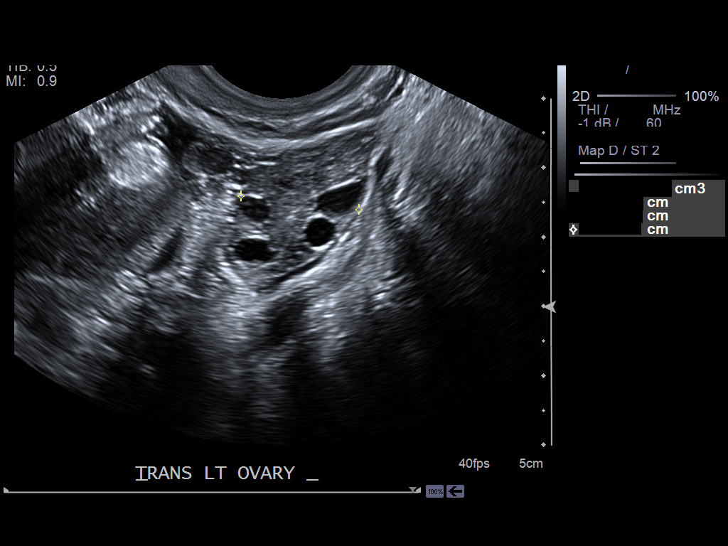

[14 of 25 positions shown; findings below may reference images not displayed]

FINDINGS: The uterus demonstrates a homogeneous echotexture and measures
7.19 x 3.69 of 4.2 cm with endometrial thickness of 4 mm and a homogeneous
echotexture. A trace amount of fluid is involved in the cul-de-sac. The
right ovary measures 3.43 x 2.07 x 1.99 cm and the left 3.1 x 1.7 x 1.7 cm.
Follicles are appreciated within the right and left ovaries with a dominant
follicle versus a small cyst in the right ovary measuring 1.29 x 1.06 cm.
Arterial and venous waveforms are appreciated within the ovaries. There is
no evidence of hydronephrosis.
IMPRESSION: Unremarkable pelvic ultrasound.

## 2014-10-13 ENCOUNTER — Emergency Department: Payer: Self-pay | Admitting: Emergency Medicine

## 2014-10-13 LAB — CBC WITH DIFFERENTIAL/PLATELET
Basophil #: 0.1 10*3/uL (ref 0.0–0.1)
Basophil %: 0.3 %
EOS PCT: 0.2 %
Eosinophil #: 0 10*3/uL (ref 0.0–0.7)
HCT: 43.5 % (ref 35.0–47.0)
HGB: 14.9 g/dL (ref 12.0–16.0)
LYMPHS ABS: 1.3 10*3/uL (ref 1.0–3.6)
Lymphocyte %: 7.2 %
MCH: 31.2 pg (ref 26.0–34.0)
MCHC: 34.3 g/dL (ref 32.0–36.0)
MCV: 91 fL (ref 80–100)
Monocyte #: 0.8 x10 3/mm (ref 0.2–0.9)
Monocyte %: 4.8 %
NEUTROS ABS: 15.3 10*3/uL — AB (ref 1.4–6.5)
Neutrophil %: 87.5 %
Platelet: 458 10*3/uL — ABNORMAL HIGH (ref 150–440)
RBC: 4.79 10*6/uL (ref 3.80–5.20)
RDW: 13.7 % (ref 11.5–14.5)
WBC: 17.5 10*3/uL — AB (ref 3.6–11.0)

## 2014-10-14 LAB — COMPREHENSIVE METABOLIC PANEL
ALBUMIN: 4.2 g/dL (ref 3.4–5.0)
ALK PHOS: 78 U/L (ref 46–116)
ALT: 26 U/L (ref 14–63)
Anion Gap: 9 (ref 7–16)
BILIRUBIN TOTAL: 0.6 mg/dL (ref 0.2–1.0)
BUN: 11 mg/dL (ref 7–18)
CALCIUM: 9.7 mg/dL (ref 8.5–10.1)
Chloride: 103 mmol/L (ref 98–107)
Co2: 25 mmol/L (ref 21–32)
Creatinine: 0.82 mg/dL (ref 0.60–1.30)
EGFR (Non-African Amer.): >60
GLUCOSE: 105 mg/dL — AB (ref 65–99)
OSMOLALITY: 274 (ref 275–301)
Potassium: 3.2 mmol/L — ABNORMAL LOW (ref 3.5–5.1)
SGOT(AST): 26 U/L (ref 15–37)
SODIUM: 137 mmol/L (ref 136–145)
Total Protein: 8.6 g/dL — ABNORMAL HIGH (ref 6.4–8.2)

## 2014-10-14 LAB — URINALYSIS, COMPLETE
Bilirubin,UR: NEGATIVE
Blood: NEGATIVE
Glucose,UR: NEGATIVE mg/dL (ref 0–75)
NITRITE: NEGATIVE
Ph: 6 (ref 4.5–8.0)
SPECIFIC GRAVITY: 1.032 (ref 1.003–1.030)
Squamous Epithelial: 15
WBC UR: 5 /HPF (ref 0–5)

## 2014-10-14 LAB — LIPASE, BLOOD: Lipase: 61 U/L — ABNORMAL LOW (ref 73–393)

## 2014-10-14 LAB — HCG, QUANTITATIVE, PREGNANCY: BETA HCG, QUANT.: 51788 m[IU]/mL — AB

## 2015-12-12 ENCOUNTER — Encounter: Payer: Self-pay | Admitting: Emergency Medicine

## 2015-12-12 ENCOUNTER — Emergency Department
Admission: EM | Admit: 2015-12-12 | Discharge: 2015-12-13 | Disposition: A | Discharge status: Home / Self Care | Payer: BLUE CROSS/BLUE SHIELD | Attending: Emergency Medicine | Admitting: Emergency Medicine

## 2015-12-12 DIAGNOSIS — B372 Candidiasis of skin and nail: Secondary | ICD-10-CM | POA: Diagnosis not present

## 2015-12-12 DIAGNOSIS — Z87891 Personal history of nicotine dependence: Secondary | ICD-10-CM | POA: Insufficient documentation

## 2015-12-12 DIAGNOSIS — R21 Rash and other nonspecific skin eruption: Secondary | ICD-10-CM | POA: Diagnosis present

## 2015-12-12 LAB — URINALYSIS COMPLETE WITH MICROSCOPIC (ARMC ONLY)
BILIRUBIN URINE: NEGATIVE
Bacteria, UA: NONE SEEN
Glucose, UA: NEGATIVE mg/dL
Leukocytes, UA: NEGATIVE
NITRITE: NEGATIVE
PH: 6 (ref 5.0–8.0)
PROTEIN: NEGATIVE mg/dL
SPECIFIC GRAVITY, URINE: 1.004 — AB (ref 1.005–1.030)
WBC, UA: NONE SEEN WBC/hpf (ref 0–5)

## 2015-12-12 LAB — PREGNANCY, URINE: Preg Test, Ur: NEGATIVE

## 2015-12-12 NOTE — ED Notes (Signed)
Pt states she thinks she has a UTI and a rash on her buttocks. States when she "scratches the rash she pees a little bit"

## 2015-12-12 NOTE — ED Notes (Signed)
Pt presents to Ed with c/o rash on buttocks . "everytime I scratch it , i pee". Denies urine frequency , and burning.

## 2015-12-12 NOTE — ED Provider Notes (Signed)
[facility] Emergency Department Provider Note  ____________________________________________  Time seen: Approximately 11:33 PM  I have reviewed the triage vital signs and the nursing notes.   HISTORY  Chief Complaint Rash    HPI Morgan Lopez is a 25 y.o. female for evaluation of a itchy rash in the folds of the buttockstarting one week ago. She reports that she has a rash that is been dry, cracked, and in the upper cleft between her buttocks cheeks.  Denies any other symptoms. She does report that sometimes when she itches and it makes her want to urinate, and she does occasionally urinate while she's itching. She denies any trouble with urination. No burning or bleeding. She states her last menstrual periods started about one week ago and was normal and she has not had any vaginal symptoms such as discharge itching or burning. No pain or burning around the vagina area where the lower portion of the buttock.  She has not noticed any swollen area or "abscess". Denies pregnancy.  No fevers chills or weakness. He is been able to go to work normally, but today noted that the area seemed to have some slight amount of water or liquidy stickiness to it.   Past Medical History  Diagnosis Date  . Anemia     Patient Active Problem List   Diagnosis Date Noted  . 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 No past surgeries    . Orif ankle fracture Right 01/04/2014    Procedure: OPEN REDUCTION INTERNAL FIXATION (ORIF) ANKLE FRACTURE;  Surgeon: Nadara MustardMarcus V Duda, MD;  Location: [facility] OR;  Service: Orthopedics;  Laterality: Right;  Open Reduction Internal Fixation Right Fibula Fracture and Take Down Non-Union     Current Outpatient Rx  Name  Route  Sig  Dispense  Refill  . clotrimazole (LOTRIMIN) 1 % cream   Topical   Apply 1 application topically 2 (two) times daily.   30 g   0   . Estradiol Valerate-Dienogest (NATAZIA)  3/2-2/2-3/1 MG tablet   Oral   Take 1 tablet by mouth daily.         Marland Kitchen. oxyCODONE-acetaminophen (ROXICET) 5-325 MG per tablet   Oral   Take 1 tablet by mouth every 4 (four) hours as needed for severe pain.   60 tablet   0     Allergies Review of patient's allergies indicates no known allergies.  History reviewed. No pertinent family history.  Social History Social History  Substance Use Topics  . Smoking status: Former Smoker -- 0.25 packs/day for 4 years    Quit date: 11/20/2013  . Smokeless tobacco: Never Used  . Alcohol Use: No    Review of Systems Constitutional: No fever/chills Eyes: No visual changes. ENT: No sore throat. Cardiovascular: Denies chest pain. Respiratory: Denies shortness of breath. Gastrointestinal: No abdominal pain.  No nausea, no vomiting.  No diarrhea.  No constipation. Genitourinary: Negative for dysuria. Musculoskeletal: Negative for back pain. Skin: Negative for rash septum is noted, see history of present illness. Neurological: Negative for headaches, focal weakness or numbness.  10-point ROS otherwise negative.  ____________________________________________   PHYSICAL EXAM:  VITAL SIGNS: ED Triage Vitals  Enc Vitals Group     BP 12/12/15 2115 159/69 mmHg     Pulse Rate 12/12/15 2115 99     Resp 12/12/15 2115 18     Temp 12/12/15 2115 98.6 F (37 C)     Temp Source  12/12/15 2115 Oral     SpO2 12/12/15 2115 97 %     Weight 12/12/15 2115 160 lb (72.576 kg)     Height 12/12/15 2115  (1.676 m)     Head Cir --      Peak Flow --      Pain Score --      Pain Loc --      Pain Edu? --      Excl. in GC? --    Constitutional: Alert and oriented. Well appearing and in no acute distress. Eyes: Conjunctivae are normal. PERRL. EOMI. Head: Atraumatic. Nose: No congestion/rhinnorhea. Mouth/Throat: Mucous membranes are moist.  No oral lesions. Neck: No stridor.   Cardiovascular: Normal rate, regular rhythm. Grossly normal heart  sounds.  Good peripheral circulation. Respiratory: Normal respiratory effort.  No retractions. Lungs CTAB. Gastrointestinal: Soft and nontender. No distention.  Musculoskeletal: No lower extremity tenderness nor edema.  No joint effusions. Neurologic:  Normal speech and language. No gross focal neurologic deficits are appreciated. Skin:  Skin is warm, dry and intact. No rash noted except along the approximately 1 inch area of the superior gluteal cleft which demonstrates some mild erythema, slight serous-appearing or slightly white discharge, but no frank skin breakdown, also noted are some slight satellite type lesions. The remainder of the gluteal cleft and perineum are normal. No abscess or induration. Psychiatric: Mood and affect are normal. Speech and behavior are normal.  ____________________________________________   LABS (all labs ordered are listed, but only abnormal results are displayed)  Labs Reviewed  URINALYSIS COMPLETEWITH MICROSCOPIC ([facility] ONLY) - Abnormal; Notable for the following:    Color, Urine STRAW (*)    APPearance CLEAR (*)    Ketones, ur 1+ (*)    Specific Gravity, Urine 1.004 (*)    Hgb urine dipstick 1+ (*)    Squamous Epithelial / LPF 0-5 (*)    All other components within normal limits  WOUND CULTURE  PREGNANCY, URINE   ____________________________________________  EKG   ____________________________________________  RADIOLOGY   ____________________________________________   PROCEDURES  Procedure(s) performed: None  Critical Care performed: No  ____________________________________________   INITIAL IMPRESSION / ASSESSMENT AND PLAN / ED COURSE  Pertinent labs & imaging results that were available during my care of the patient were reviewed by me and considered in my medical decision making (see chart for details).  Patient lives with rash noted this appeared gluteal cleft. No evidence of extension into the perineum, abscess, or  complicating superficial cellulitis. Appears to be most consistent with likely a fungal type rash given small satellite lesions, pruritus. We will treat her with clotrimazole cream, she'll monitor symptoms closely and plans to follow up with her primary, dermatology or Alternatively come to the walk-in clinic for reevaluation about 3 days to see if there is improvement.  Careful return precautions advised. No wound culture was performed to see if yeast or other organism concluded identified. ____________________________________________   FINAL CLINICAL IMPRESSION(S) / ED DIAGNOSES  Final diagnoses:  Skin yeast infection      Sharyn Creamer, MD 12/13/15 [phone]

## 2015-12-13 MED ORDER — CLOTRIMAZOLE 1 % EX CREA: 1.0000 | TOPICAL_CREAM | Freq: Two times a day (BID) | CUTANEOUS | Status: DC

## 2015-12-13 NOTE — Discharge Instructions (Signed)
Please follow-up with your doctor, dermatology, or [facility] in about 3 days for recheck for healing.  Return to the emergency room if she developed worsening symptoms, redness especially along the lower portions of the buttock or around the vagina, had a fever, blistering rash, weakness, increasing drainage, noticed an area that is "swollen or may be an abscess" or other new concerns arise  Cutaneous Candidiasis Cutaneous candidiasis is a condition in which there is an overgrowth of yeast (candida) on the skin. Yeast normally live on the skin, but in small enough numbers not to cause any symptoms. In certain cases, increased growth of the yeast may cause an actual yeast infection. This kind of infection usually occurs in areas of the skin that are constantly warm and moist, such as the armpits or the groin. Yeast is the most common cause of diaper rash in babies and in people who cannot control their bowel movements (incontinence). CAUSES  The fungus that most often causes cutaneous candidiasis is Candida albicans. Conditions that can increase the risk of getting a yeast infection of the skin include:  Obesity.  Pregnancy.  Diabetes.  Taking antibiotic medicine.  Taking birth control pills.  Taking steroid medicines.  Thyroid disease.  An iron or zinc deficiency.  Problems with the immune system. SYMPTOMS   Red, swollen area of the skin.  Bumps on the skin.  Itchiness. DIAGNOSIS  The diagnosis of cutaneous candidiasis is usually based on its appearance. Light scrapings of the skin may also be taken and viewed under a microscope to identify the presence of yeast. TREATMENT  Antifungal creams may be applied to the infected skin. In severe cases, oral medicines may be needed.  HOME CARE INSTRUCTIONS   Keep your skin clean and dry.  Maintain a healthy weight.  If you have diabetes, keep your blood sugar under control. SEEK IMMEDIATE MEDICAL CARE IF:  Your  rash continues to spread despite treatment.  You have a fever, chills, or abdominal pain.   This information is not intended to replace advice given to you by your health care provider. Make sure you discuss any questions you have with your health care provider.   Document Released: 05/18/2011 Document Revised: 11/22/2011 Document Reviewed: 03/03/2015 Elsevier Interactive Patient Education [employer]2016 Elsevier Inc.

## 2015-12-13 NOTE — ED Notes (Signed)
Resent wound culture in blue tube.

## 2015-12-16 LAB — WOUND CULTURE: Special Requests: NORMAL

## 2015-12-17 NOTE — Progress Notes (Signed)
Clindamycin prescription called into [facility] [address] for patient.  Authorizing provide: Dr. Suella BroadLinda Taylor.  Demetrius Charityeldrin D. Sharelle Burditt, PharmD, BCPS Clinical Pharmacist.

## 2016-03-29 ENCOUNTER — Emergency Department: Payer: No Typology Code available for payment source

## 2016-03-29 ENCOUNTER — Emergency Department
Admission: EM | Admit: 2016-03-29 | Discharge: 2016-03-29 | Disposition: A | Discharge status: Home / Self Care | Payer: No Typology Code available for payment source | Attending: Emergency Medicine | Admitting: Emergency Medicine

## 2016-03-29 DIAGNOSIS — Y92410 Unspecified street and highway as the place of occurrence of the external cause: Secondary | ICD-10-CM | POA: Diagnosis not present

## 2016-03-29 DIAGNOSIS — Y939 Activity, unspecified: Secondary | ICD-10-CM | POA: Diagnosis not present

## 2016-03-29 DIAGNOSIS — Y999 Unspecified external cause status: Secondary | ICD-10-CM | POA: Diagnosis not present

## 2016-03-29 DIAGNOSIS — Z87891 Personal history of nicotine dependence: Secondary | ICD-10-CM | POA: Insufficient documentation

## 2016-03-29 DIAGNOSIS — S39012A Strain of muscle, fascia and tendon of lower back, initial encounter: Secondary | ICD-10-CM

## 2016-03-29 DIAGNOSIS — M545 Low back pain: Secondary | ICD-10-CM | POA: Diagnosis present

## 2016-03-29 MED ORDER — TRAMADOL HCL 50 MG PO TABS: 50.0000 mg | ORAL_TABLET | Freq: Four times a day (QID) | ORAL | Status: AC | PRN

## 2016-03-29 MED ORDER — IBUPROFEN 600 MG PO TABS: 600.0000 mg | ORAL_TABLET | Freq: Four times a day (QID) | ORAL | Status: DC | PRN

## 2016-03-29 MED ORDER — METHOCARBAMOL 750 MG PO TABS: 750.0000 mg | ORAL_TABLET | Freq: Four times a day (QID) | ORAL | Status: DC
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
## 2019-05-16 ENCOUNTER — Other Ambulatory Visit: Payer: Self-pay

## 2019-05-16 DIAGNOSIS — Z20822 Contact with and (suspected) exposure to covid-19: Secondary | ICD-10-CM

## 2019-05-17 LAB — NOVEL CORONAVIRUS, NAA: SARS-CoV-2, NAA: NOT DETECTED

## 2019-05-28 ENCOUNTER — Encounter: Payer: Self-pay | Admitting: Advanced Practice Midwife

## 2019-05-28 ENCOUNTER — Other Ambulatory Visit: Payer: Self-pay

## 2019-05-28 ENCOUNTER — Ambulatory Visit: Payer: Self-pay | Admitting: Advanced Practice Midwife

## 2019-05-28 DIAGNOSIS — Z113 Encounter for screening for infections with a predominantly sexual mode of transmission: Secondary | ICD-10-CM

## 2019-05-28 LAB — WET PREP FOR TRICH, YEAST, CLUE
Trichomonas Exam: NEGATIVE
Trichomonas Exam: NEGATIVE

## 2019-05-28 LAB — PREGNANCY, URINE
Preg Test, Ur: NEGATIVE
Preg Test, Ur: NEGATIVE

## 2019-05-28 NOTE — Progress Notes (Signed)
Wet prep reviewed-no Tx indicated, per CNM; advised if s/s persist-may need primary care Morgan Lopez

## 2019-05-28 NOTE — Progress Notes (Signed)
In due to "discomfort" x 2-3 days; was recently treated for UTI via virtual visit with Insurance provider-took Nitrofurantoin and completed 05/11/19; denies dysuria; desires HIV/RPR testing also Debera Lat, RN

## 2019-05-28 NOTE — Progress Notes (Signed)
    STI clinic/screening visit  Subjective:  Morgan Lopez is a 28 y.o. female being seen today for an STI screening visit. The patient reports they do have symptoms; c/o feeling "different" with urinary frequency and sharp pains external vagina since UTI dx'd 05/01/19 and treated with Macrobid x 7 days beginning 05/01/19.  Patient has the following medical conditions:   Patient Active Problem List   Diagnosis Date Noted  . 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 SEXUALLY TRANSMITTED DISEASE    HPI  Patient reports urinary frequency and sharp pains external vagina since UTI dx'd 05/01/19  See flowsheet for further details and programmatic requirements.    The following portions of the patient's history were reviewed and updated as appropriate: allergies, current medications, past medical history, past social history, past surgical history and problem list.  Objective:  There were no vitals filed for this visit.  Physical Exam    Assessment and Plan:  Morgan Lopez is a 28 y.o. female presenting to the [facility] Department for STI screening  1. Screening examination for venereal disease Treat wet mount per standing orders Immunization nurse consult Referred to primary care MD if UTI sxs persist - WET PREP FOR [place], YEAST, CLUE - Pregnancy, urine - Syphilis Serology, [place] [facility] - HIV [place] [facility] - Chlamydia/Gonorrhea [place] [facility]     Return if symptoms worsen or fail to improve.  No future appointments.  Herbie Saxon, CNM

## 2019-07-24 ENCOUNTER — Other Ambulatory Visit: Payer: Self-pay

## 2019-07-24 DIAGNOSIS — Z20822 Contact with and (suspected) exposure to covid-19: Secondary | ICD-10-CM

## 2019-07-26 LAB — NOVEL CORONAVIRUS, NAA: SARS-CoV-2, NAA: NOT DETECTED

## 2020-01-31 ENCOUNTER — Other Ambulatory Visit: Payer: Self-pay | Admitting: Family Medicine

## 2020-01-31 DIAGNOSIS — Z8742 Personal history of other diseases of the female genital tract: Secondary | ICD-10-CM

## 2020-02-07 ENCOUNTER — Ambulatory Visit
Admission: RE | Admit: 2020-02-07 | Discharge: 2020-02-07 | Disposition: A | Discharge status: Home / Self Care | Payer: 59 | Source: Ambulatory Visit | Attending: Family Medicine | Admitting: Family Medicine

## 2020-02-07 DIAGNOSIS — Z8742 Personal history of other diseases of the female genital tract: Secondary | ICD-10-CM | POA: Diagnosis present

## 2020-04-28 ENCOUNTER — Ambulatory Visit: Payer: Self-pay | Attending: Internal Medicine

## 2020-04-28 DIAGNOSIS — Z23 Encounter for immunization: Secondary | ICD-10-CM

## 2020-04-28 NOTE — Progress Notes (Signed)
   Covid-19 Vaccination Clinic  Name:  Morgan Lopez    MRN: [id number] DOB: 03/11/1991  04/28/2020  Ms. Morgan Lopez was observed post Covid-19 immunization for 15 minutes without incident. She was provided with Vaccine Information Sheet and instruction to access the V-Safe system.   Ms. Morgan Lopez was instructed to call 911 with any severe reactions post vaccine: Marland Kitchen Difficulty breathing  . Swelling of face and throat  . A fast heartbeat  . A bad rash all over body  . Dizziness and weakness   Immunizations Administered    Name Date Dose VIS Date Route   Pfizer COVID-19 Vaccine 04/28/2020 10:14 AM 0.3 mL 11/07/2018 Intramuscular   Manufacturer: [employer], [employer]   Lot: [id number]   NDC: [address]-2

## 2020-05-26 ENCOUNTER — Ambulatory Visit: Payer: 59

## 2020-05-26 ENCOUNTER — Ambulatory Visit: Payer: 59 | Attending: Internal Medicine

## 2020-05-26 DIAGNOSIS — Z23 Encounter for immunization: Secondary | ICD-10-CM

## 2020-05-26 NOTE — Progress Notes (Signed)
   Covid-19 Vaccination Clinic  Name:  Marionette Meskill    MRN: [id number] DOB: 1991-08-20  05/26/2020  Ms. Baxley was observed post Covid-19 immunization for 15 minutes without incident. She was provided with Vaccine Information Sheet and instruction to access the V-Safe system.   Ms. Dolce was instructed to call 911 with any severe reactions post vaccine: Marland Kitchen Difficulty breathing  . Swelling of face and throat  . A fast heartbeat  . A bad rash all over body  . Dizziness and weakness   Immunizations Administered    Name Date Dose VIS Date Route   Pfizer COVID-19 Vaccine 05/26/2020  4:16 PM 0.3 mL 11/07/2018 Intramuscular   Manufacturer: [employer], [employer]   Lot: [id number]   NDC: [address]-9

## 2021-07-07 IMAGING — US US PELVIS COMPLETE WITH TRANSVAGINAL
1 series · 14 of 25 positions shown · non-contrast
Comparison: None

CLINICAL DATA: History of menorrhagia

EXAM:
TRANSABDOMINAL AND TRANSVAGINAL ULTRASOUND OF PELVIS
TECHNIQUE: Both transabdominal and transvaginal ultrasound examinations of the
pelvis were performed. Transabdominal technique was performed for
global imaging of the pelvis including uterus, ovaries, adnexal
regions, and pelvic cul-de-sac. It was necessary to proceed with
endovaginal exam following the transabdominal exam to visualize the
uterus endometrium ovaries.

[Series 1: us pelvic complete with transvaginal · 14 of 93 slices shown]
[im 1/93]
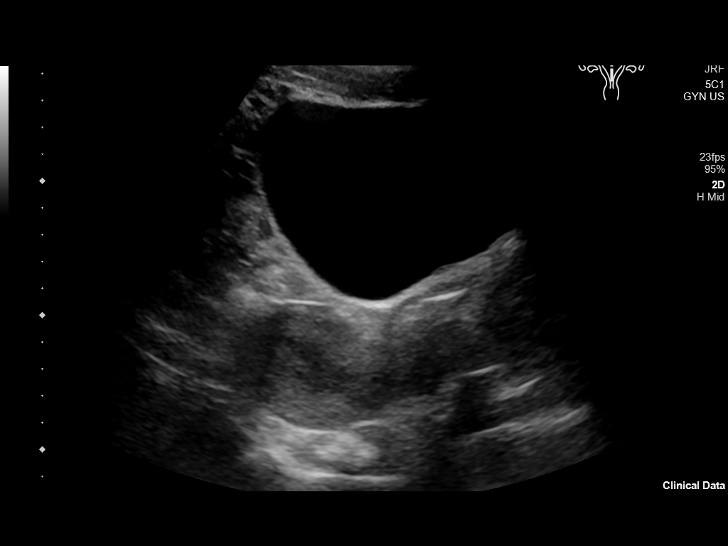
[im 8/93]
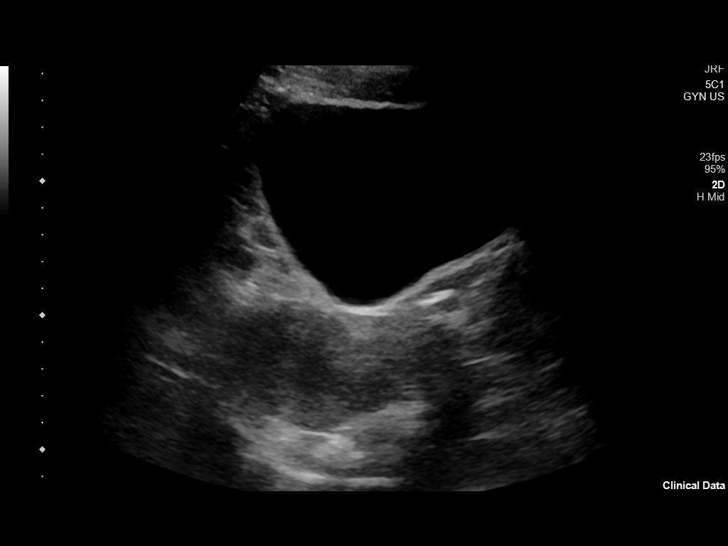
[im 16/93]
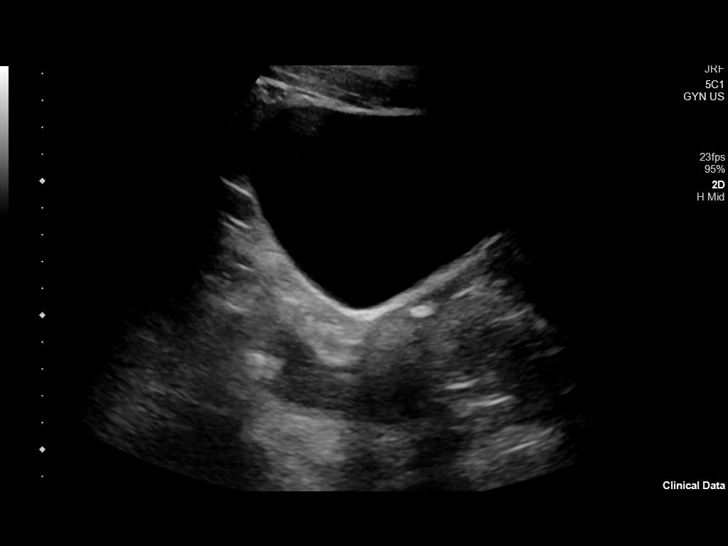
[im 24/93]
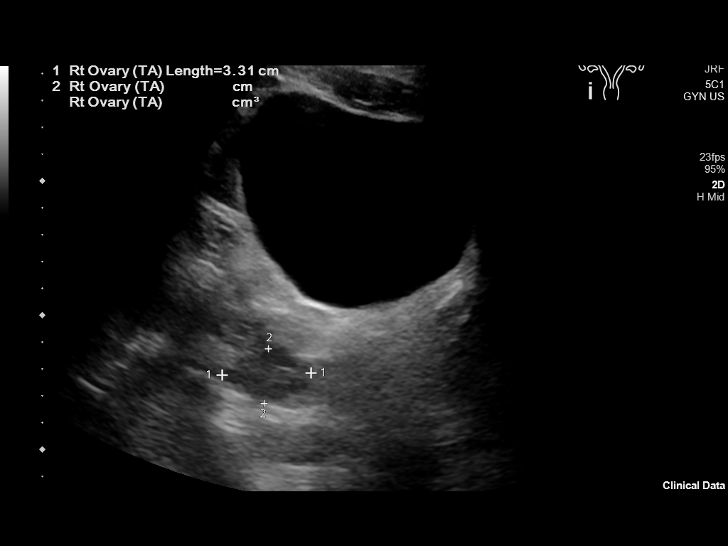
[im 31/93]
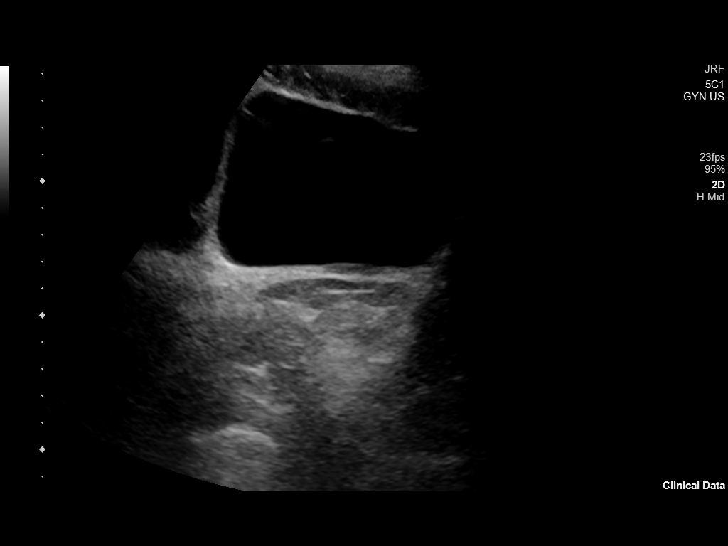
[im 35/93]
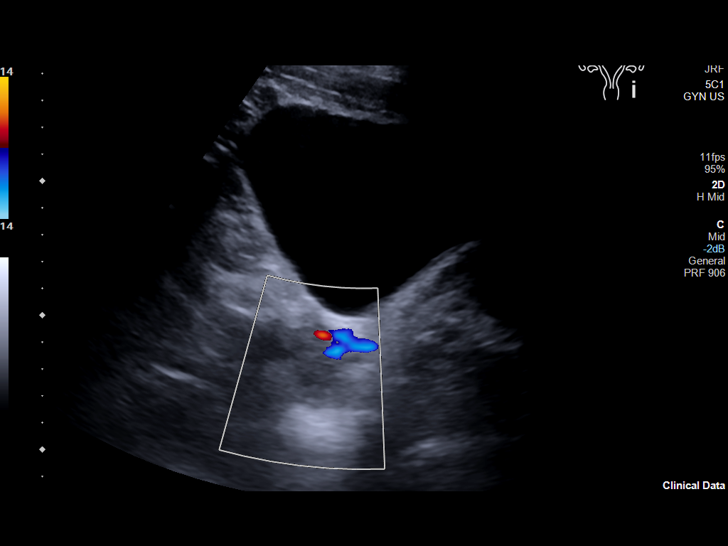
[im 43/93]
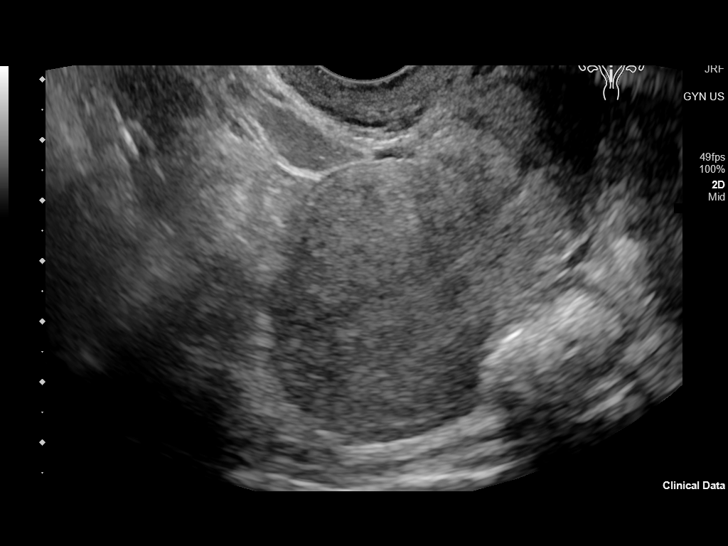
[im 50/93]
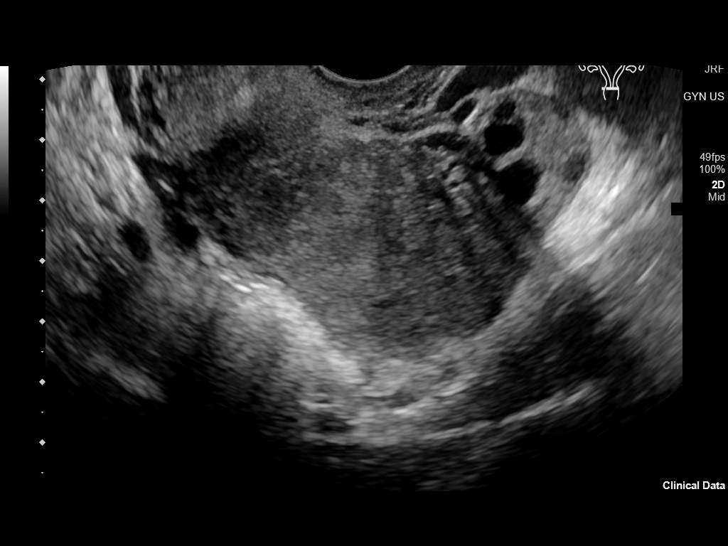
[im 58/93]
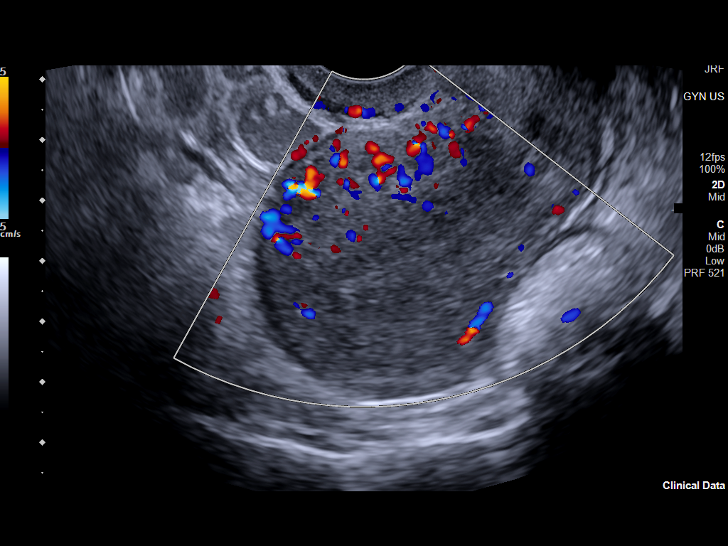
[im 62/93]
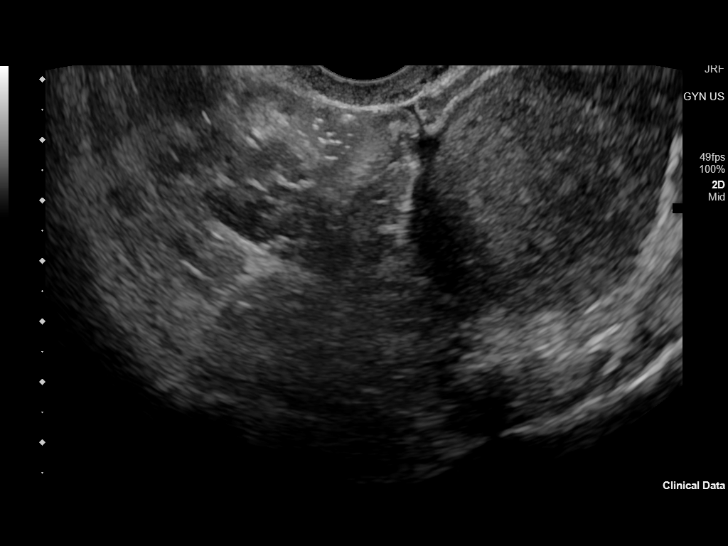
[im 70/93]
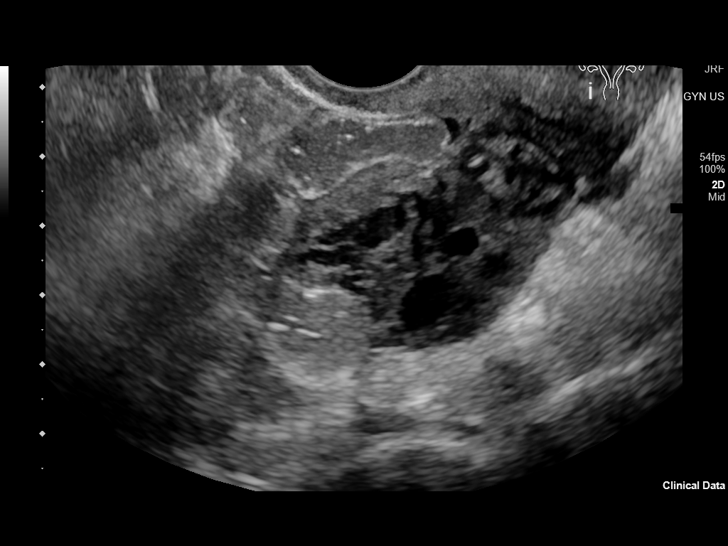
[im 77/93]
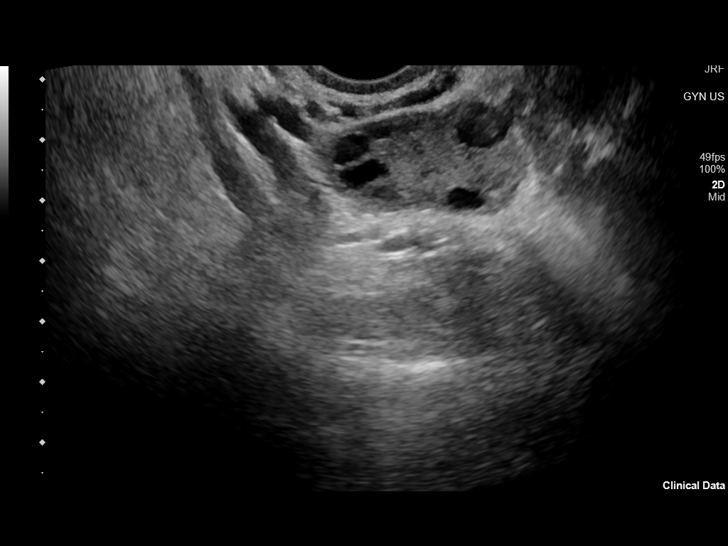
[im 85/93]
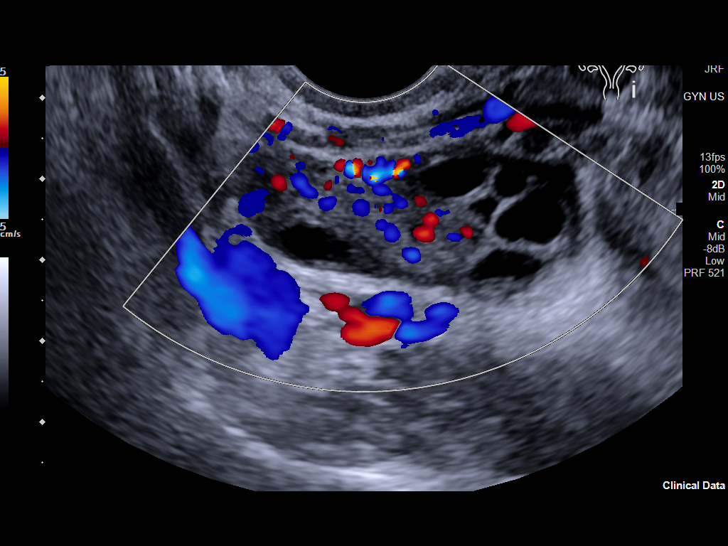
[im 93/93]
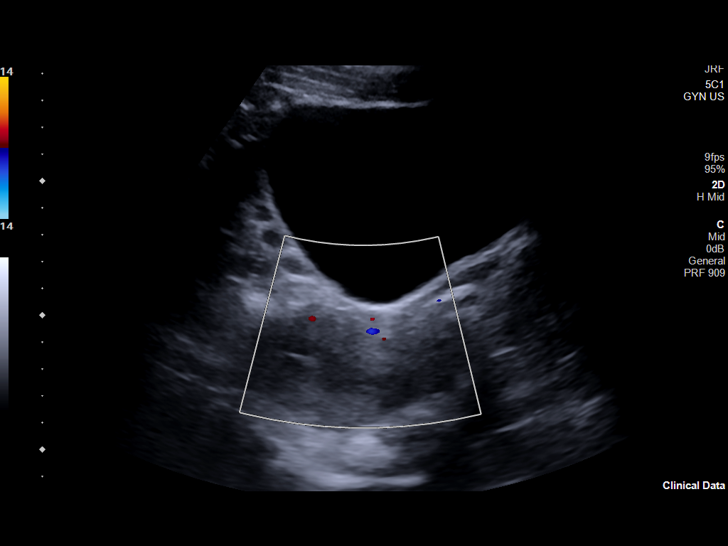

[14 of 25 positions shown; findings below may reference images not displayed]

FINDINGS: Uterus

Measurements: 7.3 x 4.6 x 4.9 cm = volume: 85 mL. No fibroids or
other mass visualized.

Endometrium

Thickness: 4.2 mL.  No focal abnormality visualized.

Right ovary

Measurements: 3.8 x 2.4 x 2.6 cm = volume: 12 mL. Normal
appearance/no adnexal mass.

Left ovary

Measurements: 4 x 1.8 x 2 cm = volume: 7 mL. Normal appearance/no
adnexal mass.

Other findings

No abnormal free fluid.
IMPRESSION: Negative pelvic ultrasound.

## 2024-08-23 ENCOUNTER — Ambulatory Visit: Admitting: Family Medicine

## 2024-08-23 ENCOUNTER — Encounter: Payer: Self-pay | Admitting: Family Medicine

## 2024-08-23 VITALS — BP 132/78 | HR 72 | Resp 16 | Ht 66.5 in | Wt 183.5 lb

## 2024-08-23 DIAGNOSIS — E663 Overweight: Secondary | ICD-10-CM | POA: Insufficient documentation

## 2024-08-23 DIAGNOSIS — Z862 Personal history of diseases of the blood and blood-forming organs and certain disorders involving the immune mechanism: Secondary | ICD-10-CM

## 2024-08-23 DIAGNOSIS — R519 Headache, unspecified: Secondary | ICD-10-CM

## 2024-08-23 DIAGNOSIS — F321 Major depressive disorder, single episode, moderate: Secondary | ICD-10-CM

## 2024-08-23 DIAGNOSIS — J302 Other seasonal allergic rhinitis: Secondary | ICD-10-CM | POA: Insufficient documentation

## 2024-08-23 DIAGNOSIS — G43109 Migraine with aura, not intractable, without status migrainosus: Secondary | ICD-10-CM | POA: Insufficient documentation

## 2024-08-23 DIAGNOSIS — F411 Generalized anxiety disorder: Secondary | ICD-10-CM

## 2024-08-23 DIAGNOSIS — Z13220 Encounter for screening for lipoid disorders: Secondary | ICD-10-CM

## 2024-08-23 DIAGNOSIS — Z1159 Encounter for screening for other viral diseases: Secondary | ICD-10-CM

## 2024-08-23 DIAGNOSIS — Z1321 Encounter for screening for nutritional disorder: Secondary | ICD-10-CM

## 2024-08-23 DIAGNOSIS — Z114 Encounter for screening for human immunodeficiency virus [HIV]: Secondary | ICD-10-CM

## 2024-08-23 MED ORDER — SERTRALINE HCL 25 MG PO TABS: 25.0000 mg | ORAL_TABLET | Freq: Every day | ORAL | 3 refills | Status: DC

## 2024-08-23 NOTE — Progress Notes (Signed)
 New patient visit   Patient: Morgan Lopez   DOB: 04/29/1991   33 y.o. Female  MRN: [id number] Visit Date: 08/23/2024  Today's healthcare provider: Rockie Agent, MD   Chief Complaint  Patient presents with   Establish Care   New Patient (Initial Visit)    Patient here to establish care. Previous PCP: [facility] Last pap: 2 years ago-will request record [facility] Immunizations: Tdap is up to date-Flu vaccine   Subjective    Morgan Lopez is a 33 y.o. female who presents today as a new patient to establish care.   HPI     New Patient (Initial Visit)    Additional comments: Patient here to establish care. Previous PCP: [facility] Last pap: 2 years ago-will request record from [facility]. Immunizations: Tdap is up to date-Flu vaccine      Last edited by Rosas, Joseline E, CMA on 08/23/2024  9:02 AM.       Discussed the use of AI scribe software for clinical note transcription with the patient, who gave verbal consent to proceed.  History of Present Illness [name] is a 33 year old female with a history of migraines who presents to establish care as a new patient.  She has experienced migraines since contracting COVID-19 in 2020, characterized by vision disturbances, nausea, and a sensation of pressure behind her eye. Triggers include stress and certain smells like cinnamon, peaches, and roses. Without medication, migraines last four to five days, but with clevidipine and clozapine, they are more manageable, allowing her to sleep through the night. She has tried [employer], nortriptyline, and steroids, but finds clevidipine and clozapine most effective. Migraines occur once a month or every three months, with the last severe episode around Thanksgiving.  She experiences daily tension headaches, described as a 'big rubber band squeezing' her head. She uses Seroquel 50 mg daily for headaches and Phenergan  25 mg as needed for nausea. She  also takes clonazepam 0.5 mg three times a day for anxiety and headache management.  She has a history of anxiety and depression, with a generalized anxiety score of 16 and a PHQ-9 depression score of 15. She attributes her anxiety to life stressors, including her mother's health issues following a stroke, the loss of her grandmother, and financial difficulties after losing her job. Clonazepam helps with her anxiety, but she is not on any medication specifically for depression.  Her social history includes a past history of tobacco use, having smoked for about four years. She is currently employed at [employer] and is pursuing further education. She has a 65 year old son and is the primary caregiver for her mother, who has health complications following a stroke. She reports significant stress related to caregiving responsibilities and financial pressures.  She takes off-brand Benadryl and Claritin every other day for winter allergies, which cause itchy and red eyes.      Past Medical History:  Diagnosis Date   Anemia    Anxiety    Depression    Tension headache     Show/hide medication list[1]  Past Surgical History:  Procedure Laterality Date   NO PAST SURGERIES     ORIF ANKLE FRACTURE Right 01/04/2014   Procedure: OPEN REDUCTION INTERNAL FIXATION (ORIF) ANKLE FRACTURE;  Surgeon: Jerona LULLA Sage, MD;  Location: [facility] OR;  Service: Orthopedics;  Laterality: Right;  Open Reduction Internal Fixation Right Fibula Fracture and Take Down Non-Union    Family Status  Relation Name Status  Mother  (Not Specified)   Father  (Not Specified)   Mat Aunt  (Not Specified)   Bruna Nyhan  (Not Specified)   MGM  (Not Specified)   MGF  (Not Specified)   PGM  (Not Specified)  No partnership data on file   Family History  Problem Relation Age of Onset   Hypertension Mother    Heart failure Father    Diabetes Maternal Aunt    Hypertension Paternal Aunt    Diabetes Maternal Grandmother    Heart attack  Maternal Grandfather    Diabetes Paternal Grandmother    Social History   Socioeconomic History   Marital status: Single    Spouse name: Not on file   Number of children: Not on file   Years of education: Not on file   Highest education level: Not on file  Occupational History   Not on file  Tobacco Use   Smoking status: Former    Current packs/day: 0.00    Average packs/day: 0.3 packs/day for 4.0 years (1.0 ttl pk-yrs)    Types: Cigarettes    Start date: 11/20/2009    Quit date: 11/20/2013    Years since quitting: 10.7   Smokeless tobacco: Never  Vaping Use   Vaping status: Never Used  Substance and Sexual Activity   Alcohol use: No   Drug use: No   Sexual activity: Not Currently  Other Topics Concern   Not on file  Social History Narrative   Not on file   Social Drivers of Health   Tobacco Use: Medium Risk (08/23/2024)   Patient History    Smoking Tobacco Use: Former    Smokeless Tobacco Use: Never    Passive Exposure: Not on Actuary Strain: Not on file  Food Insecurity: Not on file  Transportation Needs: Not on file  Physical Activity: Not on file  Stress: Not on file  Social Connections: Not on file  Depression (PHQ2-9): High Risk (08/23/2024)   Depression (PHQ2-9)    PHQ-2 Score: 15  Alcohol Screen: Not on file  Housing: Not on file  Utilities: Not on file  Health Literacy: Not on file     Allergies[2]  Immunization History  Administered Date(s) Administered   DTP 02/02/1989, 03/26/1989, 06/10/1989, 04/12/1990, 03/09/1993   HPV Quadrivalent 07/19/2007, 10/12/2007, 01/04/2008   MMR 04/12/1990, 03/09/1993   OPV 02/02/1989, 03/26/1989, 04/12/1990, 03/09/1993   PFIZER(Purple Top)SARS-COV-2 Vaccination 04/28/2020, 05/26/2020   Tdap 02/02/2007, 12/06/2023    Health Maintenance  Topic Date Due   HIV Screening  Never done   Hepatitis C Screening  Never done   Hepatitis B Vaccines 19-59 Average Risk (1 of 3 - 19+ 3-dose series) Never  done   Cervical Cancer Screening (HPV/Pap Cotest)  Never done   COVID-19 Vaccine (3 - 2025-26 season) 05/14/2024   Influenza Vaccine  12/11/2024 (Originally 04/13/2024)   DTaP/Tdap/Td (8 - Td or Tdap) 12/05/2033   HPV VACCINES  Completed   Pneumococcal Vaccine  Aged Out   Meningococcal B Vaccine  Aged Out    Patient Care Team: Sharma Coyer, MD as PCP - General (Family Medicine)  Review of Systems  Last CBC Lab Results  Component Value Date   WBC 17.5 (H) 10/13/2014   HGB 14.9 10/13/2014   HCT 43.5 10/13/2014   MCV 91 10/13/2014   MCH 31.2 10/13/2014   RDW 13.7 10/13/2014   PLT 458 (H) 10/13/2014   Last metabolic panel Lab Results  Component Value Date   GLUCOSE  105 (H) 10/13/2014   NA 137 10/13/2014   K 3.2 (L) 10/13/2014   CL 103 10/13/2014   CO2 25 10/13/2014   BUN 11 10/13/2014   CREATININE 0.82 10/13/2014   GFRNONAA >60 10/13/2014   CALCIUM 9.7 10/13/2014   PROT 8.6 (H) 10/13/2014   ALBUMIN 4.2 10/13/2014   BILITOT 0.6 10/13/2014   ALKPHOS 78 10/13/2014   AST 26 10/13/2014   ALT 26 10/13/2014   ANIONGAP 9 10/13/2014   Last lipids No results found for: CHOL, HDL, LDLCALC, LDLDIRECT, TRIG, CHOLHDL Last hemoglobin A1c No results found for: HGBA1C Last thyroid functions No results found for: TSH, T3TOTAL, T4TOTAL, FREET4, THYROIDAB Last vitamin D No results found for: 25OHVITD2, 25OHVITD3, VD25OH Last vitamin B12 and Folate No results found for: VITAMINB12, FOLATE     08/23/2024    8:53 AM  GAD 7 : Generalized Anxiety Score  Nervous, Anxious, on Edge 3  Control/stop worrying 3  Worry too much - different things 3  Trouble relaxing 2  Restless 1  Easily annoyed or irritable 3  Afraid - awful might happen 1  Total GAD 7 Score 16  Anxiety Difficulty Somewhat difficult    Flowsheet Row Office Visit from 08/23/2024 in [facility]  PHQ-9 Total Score 15         Objective     BP 132/78 (BP Location: Right Arm, Patient Position: Sitting, Cuff Size: Normal)   Pulse 72   Resp 16   Ht 5' 6.5 (1.689 m)   Wt 183 lb 8 oz (83.2 kg)   LMP 07/30/2024 (Exact Date)   SpO2 97%   BMI 29.17 kg/m  BP Readings from Last 3 Encounters:  08/23/24 132/78  03/29/16 122/65  12/13/15 121/79   Wt Readings from Last 3 Encounters:  08/23/24 183 lb 8 oz (83.2 kg)  03/29/16 177 lb (80.3 kg)  12/12/15 160 lb (72.6 kg)       Depression Screen    08/23/2024    8:52 AM  PHQ 2/9 Scores  PHQ - 2 Score 3  PHQ- 9 Score 15   No results found for any visits on 08/23/24.   Physical Exam Vitals reviewed.  Constitutional:      General: She is not in acute distress.    Appearance: Normal appearance. She is not ill-appearing.  Cardiovascular:     Rate and Rhythm: Normal rate and regular rhythm.  Pulmonary:     Effort: Pulmonary effort is normal. No respiratory distress.     Breath sounds: No wheezing, rhonchi or rales.  Neurological:     Mental Status: She is alert and oriented to person, place, and time.  Psychiatric:        Attention and Perception: Attention normal.        Mood and Affect: Mood is anxious. Affect is tearful.        Speech: Speech normal.        Behavior: Behavior normal.        Assessment & Plan      Problem List Items Addressed This Visit     Anxiety   Chronic condition  Generalized anxiety with a score of 16. Anxiety is exacerbated by life stressors, including caregiving responsibilities and work-related stress. Current management includes clonazepam, which helps with anxiety symptoms. Discussed the potential benefit of therapy for anxiety management. - Continue clonazepam as prescribed. - Provided information on therapy options      Relevant Medications   sertraline (ZOLOFT) 25 MG  tablet   Depression, major, single episode, moderate (HCC)    Major depressive disorder, single episode, moderate Moderate depression with a PHQ-9 score of  15. Symptoms include feelings of being overwhelmed, difficulty coping with life stressors, and a history of significant life events contributing to depressive symptoms. Currently not on any antidepressant medication. Discussed starting Zoloft to address depressive symptoms. Potential side effects include decreased libido, weight gain, and rare gastrointestinal upset. She expressed willingness to try medication to improve emotional well-being. - Started Zoloft 25 mg daily. - Ordered screening labs including vitamin D, thyroid, B12, liver and kidney function, anemia, HIV, and hepatitis C.      Relevant Medications   sertraline (ZOLOFT) 25 MG tablet   Other Relevant Orders   TSH + free T4   Headache disorder - Primary   Chronic, improved with medication, daily tension headaches  Migraines and tension headaches  Has been treated with fiorcet and seroquel  Previously saw neurology, Dr. Lane (treated with seroquel (insomnia) and klonopin) Prior meds tried for headaches: emgality, nortriptyline, nurtec, steroids -continue seroquel 50mg  daily, phenergan  25mg  PRN  -submitted referral for migraine clinic in the [facility]  -continue PRN FIORCET        Relevant Medications   Butalbital-APAP-Caffeine 50-300-40 MG CAPS   clonazePAM (KLONOPIN) 0.5 MG tablet   sertraline (ZOLOFT) 25 MG tablet   Other Relevant Orders   Ambulatory referral to Neurology   CMP14+EGFR   Migraine with aura and without status migrainosus, not intractable   Migraine with aura and chronic tension-type headache Chronic tension-type headaches occur daily, with migraines occurring less frequently, sometimes as infrequently as once every three months. Migraines are triggered by stress, certain smells, and seasonal allergies. Current treatment includes Seroquel, Phenergan , and clonazepam, which help manage symptoms and improve sleep during migraine episodes. Previous treatments included Emgality, nortriptyline, and steroids,  with limited success. Clevidipine and clozapine have been effective in managing symptoms. She is considering further consultation with a neurologist for additional treatment options. - Continue Seroquel, Phenergan , and clonazepam as needed. - Referred to neurology for further evaluation and management of migraines. - Provided referral to [facility] for consultation.      Relevant Medications   Butalbital-APAP-Caffeine 50-300-40 MG CAPS   clonazePAM (KLONOPIN) 0.5 MG tablet   sertraline (ZOLOFT) 25 MG tablet   Other Relevant Orders   Ambulatory referral to Neurology   Overweight (BMI 25.0-29.9)   Relevant Orders   CBC   CMP14+EGFR   Lipid panel   TSH + free T4   Seasonal allergies   Chronic  Triggered by peach,apple/cinnamon scents  Continue current regimen including claritin 10mg  EOD and benadryl 25mg         Relevant Orders   Ambulatory referral to Allergy   CBC   Other Visit Diagnoses       Encounter for hepatitis C screening test for low risk patient       Relevant Orders   Hepatitis C antibody     Screening for lipid disorders       Relevant Orders   Lipid panel     History of anemia       Relevant Orders   CBC     Encounter for vitamin deficiency screening       Relevant Orders   Vitamin B12   VITAMIN D 25 Hydroxy (Vit-D Deficiency, Fractures)     Encounter for screening for HIV       Relevant Orders   HIV Antibody (routine  testing w rflx)       Assessment and Plan Assessment & Plan        Return in about 3 months (around 11/21/2024) for Depression, Anxiety.      Rockie Agent, MD  [facility] [phone] (phone) [phone] (fax)  [facility]     [1]  Outpatient Medications Prior to Visit  Medication Sig   Butalbital-APAP-Caffeine 50-300-40 MG CAPS Take 1-2 capsules by mouth every 4 (four) hours as needed.   clonazePAM (KLONOPIN) 0.5 MG tablet Take 0.5 mg three times  a day   diphenhydrAMINE (BENADRYL) 25 mg capsule Take 25 mg by mouth every 6 (six) hours as needed for allergies.   loratadine (CLARITIN) 10 MG tablet Take 10 mg by mouth daily.   promethazine  (PHENERGAN ) 25 MG tablet Take 25 mg by mouth every 4 (four) hours as needed.   QUEtiapine (SEROQUEL) 50 MG tablet Take 50 mg by mouth daily.   [DISCONTINUED] clotrimazole  (LOTRIMIN ) 1 % cream Apply 1 application topically 2 (two) times daily.   [DISCONTINUED] Estradiol  Valerate-Dienogest  (NATAZIA) 3/2-2/2-3/1 MG tablet Take 1 tablet by mouth daily.   [DISCONTINUED] ibuprofen  (ADVIL ,MOTRIN ) 600 MG tablet Take 1 tablet (600 mg total) by mouth every 6 (six) hours as needed.   [DISCONTINUED] methocarbamol  (ROBAXIN -750) 750 MG tablet Take 1 tablet (750 mg total) by mouth 4 (four) times daily.   [DISCONTINUED] oxyCODONE -acetaminophen  (ROXICET) 5-325 MG per tablet Take 1 tablet by mouth every 4 (four) hours as needed for severe pain.   No facility-administered medications prior to visit.  [2] No Known Allergies

## 2024-08-23 NOTE — Assessment & Plan Note (Signed)
 Chronic condition  Generalized anxiety with a score of 16. Anxiety is exacerbated by life stressors, including caregiving responsibilities and work-related stress. Current management includes clonazepam, which helps with anxiety symptoms. Discussed the potential benefit of therapy for anxiety management. - Continue clonazepam as prescribed. - Provided information on therapy options

## 2024-08-23 NOTE — Assessment & Plan Note (Addendum)
 Migraine with aura and chronic tension-type headache Chronic tension-type headaches occur daily, with migraines occurring less frequently, sometimes as infrequently as once every three months. Migraines are triggered by stress, certain smells, and seasonal allergies. Current treatment includes Seroquel, Phenergan , and clonazepam, which help manage symptoms and improve sleep during migraine episodes. Previous treatments included Emgality, nortriptyline, and steroids, with limited success. Clevidipine and clozapine have been effective in managing symptoms. She is considering further consultation with a neurologist for additional treatment options. - Continue Seroquel, Phenergan , and clonazepam as needed. - Referred to neurology for further evaluation and management of migraines. - Provided referral to [facility] for consultation.

## 2024-08-23 NOTE — Assessment & Plan Note (Addendum)
 Chronic  Triggered by peach,apple/cinnamon scents  Continue current regimen including claritin 10mg  EOD and benadryl 25mg 

## 2024-08-23 NOTE — Patient Instructions (Addendum)
 [email or web address]    To keep you healthy, please keep in mind the following health maintenance items that you are due for:   Health Maintenance Due  Topic Date Due   HIV Screening  Never done   Hepatitis C Screening  Never done   Hepatitis B Vaccines 19-59 Average Risk (1 of 3 - 19+ 3-dose series) Never done   Cervical Cancer Screening (HPV/Pap Cotest)  Never done   COVID-19 Vaccine (3 - 2025-26 season) 05/14/2024     Best Wishes,   Dr. Lang

## 2024-08-23 NOTE — Assessment & Plan Note (Addendum)
 Chronic, improved with medication, daily tension headaches  Migraines and tension headaches  Has been treated with fiorcet and seroquel  Previously saw neurology, Dr. Lane (treated with seroquel (insomnia) and klonopin) Prior meds tried for headaches: emgality, nortriptyline, nurtec, steroids -continue seroquel 50mg  daily, phenergan  25mg  PRN  -submitted referral for migraine clinic in the [facility]  -continue PRN FIORCET

## 2024-08-23 NOTE — Assessment & Plan Note (Signed)
°  Major depressive disorder, single episode, moderate Moderate depression with a PHQ-9 score of 15. Symptoms include feelings of being overwhelmed, difficulty coping with life stressors, and a history of significant life events contributing to depressive symptoms. Currently not on any antidepressant medication. Discussed starting Zoloft to address depressive symptoms. Potential side effects include decreased libido, weight gain, and rare gastrointestinal upset. She expressed willingness to try medication to improve emotional well-being. - Started Zoloft 25 mg daily. - Ordered screening labs including vitamin D, thyroid, B12, liver and kidney function, anemia, HIV, and hepatitis C.

## 2024-08-24 LAB — CMP14+EGFR
ALT: 16 IU/L (ref 0–32)
AST: 23 IU/L (ref 0–40)
Albumin: 4.6 g/dL (ref 3.9–4.9)
Alkaline Phosphatase: 90 IU/L (ref 41–116)
BUN/Creatinine Ratio: 13 (ref 9–23)
BUN: 11 mg/dL (ref 6–20)
Bilirubin Total: 0.5 mg/dL (ref 0.0–1.2)
CO2: 22 mmol/L (ref 20–29)
Calcium: 9.8 mg/dL (ref 8.7–10.2)
Chloride: 100 mmol/L (ref 96–106)
Creatinine, Ser: 0.87 mg/dL (ref 0.57–1.00)
Globulin, Total: 3.1 g/dL (ref 1.5–4.5)
Glucose: 80 mg/dL (ref 70–99)
Potassium: 4.1 mmol/L (ref 3.5–5.2)
Sodium: 138 mmol/L (ref 134–144)
Total Protein: 7.7 g/dL (ref 6.0–8.5)
eGFR: 89 mL/min/1.73 (ref >59)

## 2024-08-24 LAB — LIPID PANEL
Chol/HDL Ratio: 4.9 ratio — ABNORMAL HIGH (ref 0.0–4.4)
Cholesterol, Total: 226 mg/dL — ABNORMAL HIGH (ref 100–199)
HDL: 46 mg/dL (ref >39)
LDL Chol Calc (NIH): 166 mg/dL — ABNORMAL HIGH (ref 0–99)
Triglycerides: 79 mg/dL (ref 0–149)
VLDL Cholesterol Cal: 14 mg/dL (ref 5–40)

## 2024-08-24 LAB — VITAMIN D 25 HYDROXY (VIT D DEFICIENCY, FRACTURES): Vit D, 25-Hydroxy: 4.7 ng/mL — ABNORMAL LOW (ref 30.0–100.0)

## 2024-08-24 LAB — HIV ANTIBODY (ROUTINE TESTING W REFLEX): HIV Screen 4th Generation wRfx: NONREACTIVE

## 2024-08-24 LAB — CBC
Hematocrit: 43.6 % (ref 34.0–46.6)
Hemoglobin: 14.1 g/dL (ref 11.1–15.9)
MCH: 30.3 pg (ref 26.6–33.0)
MCHC: 32.3 g/dL (ref 31.5–35.7)
MCV: 94 fL (ref 79–97)
Platelets: 493 x10E3/uL — ABNORMAL HIGH (ref 150–450)
RBC: 4.65 x10E6/uL (ref 3.77–5.28)
RDW: 13.5 % (ref 11.7–15.4)
WBC: 9.1 x10E3/uL (ref 3.4–10.8)

## 2024-08-24 LAB — HEPATITIS C ANTIBODY: Hep C Virus Ab: NONREACTIVE

## 2024-08-24 LAB — TSH+FREE T4
Free T4: 1.1 ng/dL (ref 0.82–1.77)
TSH: 0.837 u[IU]/mL (ref 0.450–4.500)

## 2024-08-24 LAB — VITAMIN B12: Vitamin B-12: 294 pg/mL (ref 232–1245)

## 2024-08-28 ENCOUNTER — Ambulatory Visit: Payer: Self-pay

## 2024-08-28 ENCOUNTER — Other Ambulatory Visit: Payer: Self-pay | Admitting: Family Medicine

## 2024-08-28 DIAGNOSIS — F419 Anxiety disorder, unspecified: Secondary | ICD-10-CM

## 2024-08-28 MED ORDER — CLONAZEPAM 0.5 MG PO TABS: 0.5000 mg | ORAL_TABLET | Freq: Three times a day (TID) | ORAL | 1 refills | Status: AC | PRN

## 2024-08-28 NOTE — Telephone Encounter (Signed)
 Refill sent

## 2024-08-28 NOTE — Telephone Encounter (Signed)
 FYI Only or Action Required?: Action required by provider: clinical question for provider and update on Morgan Lopez condition.  Morgan Lopez was last seen in primary care on 08/23/2024 by Sharma Coyer, MD.  Called Nurse Triage reporting Anxiety.  Symptoms began yesterday.  Interventions attempted: Rest, hydration, or home remedies.  Symptoms are: gradually worsening.  Triage Disposition: See PCP When Office is Open (Within 3 Days)  Morgan Lopez/caregiver understands and will follow disposition?: Yes       Copied from CRM #[id number]. Topic: Clinical - Red Word Triage >> Aug 28, 2024 10:27 AM Tiffini S wrote: [facility] that prompted transfer to Nurse Triage: Morgan Lopez is experiencing high anxiety- mind is racing- trying to control thoughts and doing breathing exercises Reason for Disposition  MODERATE anxiety (e.g., persistent or frequent anxiety symptoms; interferes with sleep, school, or work)  Answer Assessment - Initial Assessment Questions 1. CONCERN: Did anything happen that prompted you to call today?       Increased anxiety, took last pill of clonazepam      2. ANXIETY SYMPTOMS: Can you describe how you (your loved one; Morgan Lopez) have been feeling? (e.g., tense, restless, panicky, anxious, keyed up, overwhelmed, sense of impending doom).       Overwhelmed, sense of impending doom, anxious     3. ONSET: How long have you been feeling this way? (e.g., hours, days, weeks)      Since yesterday   4. SEVERITY: How would you rate the level of anxiety? (e.g., 0 - 10; or mild, moderate, severe).     Moderate-severe    5. FUNCTIONAL IMPAIRMENT: How have these feelings affected your ability to do daily activities? Have you had more difficulty than usual doing your normal daily activities? (e.g., getting better, same, worse; self-care, school, work, interactions)      Yes, daily activities      6. HISTORY: Have you felt this way before? Have you ever been  diagnosed with an anxiety problem in the past? (e.g., generalized anxiety disorder, panic attacks, PTSD). If Yes, ask: How was this problem treated? (e.g., medicines, counseling, etc.)      Yes, dx. Of anxiety, last pill of clonazepam  taken yesterday      7. RISK OF HARM - SUICIDAL IDEATION: Do you ever have thoughts of hurting or killing yourself? If Yes, ask:  Do you have these feelings now? Do you have a plan on how you would do this?      No       8. TREATMENT:  What has been done so far to treat this anxiety? (e.g., medicines, relaxation strategies). What has helped?     Medication, relaxation techniques.      9. THERAPIST: Do you have a counselor or therapist? If Yes, ask: What is their name?     No, states PCP placed a referral.      11. Morgan Lopez SUPPORT: Who is with you now? Who do you live with? Do you have family or friends who you can talk to?        Yes, family and friends, religion    65. OTHER SYMPTOMS: Do you have any other symptoms? (e.g., feeling depressed, trouble concentrating, trouble sleeping, trouble breathing, palpitations or fast heartbeat, chest pain, sweating, nausea, or diarrhea)       Nausea, palpitations.    13. PREGNANCY: Is there any chance you are pregnant? When was your last menstrual period?         No    Morgan Lopez called in to triage  with complaints of increased anxiety. This has been ongoing for since yesterday. The Morgan Lopez stated since yesterday her anxiety has increased due to being out of the clonazepam . For home care, the Morgan Lopez is trying relaxation techniques.    Morgan Lopez was seen on office on 12/11 and was advised that the PCP would be placing a referral for counseling/therapy. Morgan Lopez would also like a prescription be written for the clonazepam  as she took her last pill yesterday and wishes to continue on the medication. Please advise.  Protocols used: Anxiety and Panic Attack-A-AH

## 2024-08-28 NOTE — Telephone Encounter (Signed)
 Copied from CRM #[id number]. Topic: Clinical - Medication Refill >> Aug 28, 2024 10:22 AM Tiffini S wrote: Medication: clonazePAM  (KLONOPIN ) 0.5 MG tablet   Has the patient contacted their pharmacy? Yes (Agent: If no, request that the patient contact the pharmacy for the refill. If patient does not wish to contact the pharmacy document the reason why and proceed with request.) (Agent: If yes, when and what did the pharmacy advise?)  This is the patient's preferred pharmacy:  [id number] [place], [place] - [address] AT [facility] [address] [place] [address] [place] [address] Phone: [phone] Fax: [phone]  Is this the correct pharmacy for this prescription? Yes If no, delete pharmacy and type the correct one.   Has the prescription been filled recently? No  Is the patient out of the medication? Yes, took last tablet yesterday   Has the patient been seen for an appointment in the last year OR does the patient have an upcoming appointment? No  Can we respond through MyChart? No, please call patient at [phone]  Agent: Please be advised that Rx refills may take up to 3 business days. We ask that you follow-up with your pharmacy.

## 2024-08-29 ENCOUNTER — Ambulatory Visit: Payer: Self-pay | Admitting: Family Medicine

## 2024-08-29 DIAGNOSIS — E559 Vitamin D deficiency, unspecified: Secondary | ICD-10-CM

## 2024-08-29 MED ORDER — VITAMIN D (ERGOCALCIFEROL) 1.25 MG (50000 UNIT) PO CAPS: 50000.0000 [IU] | ORAL_CAPSULE | ORAL | 0 refills | Status: DC

## 2024-08-29 NOTE — Telephone Encounter (Signed)
-----   Message from Rockie Agent, MD sent at 08/29/2024  8:52 AM EST ----- Vitamin D  level is very low. Normal range is greater than 30. Please start weekly Vitamin D  50,000 units and we will repeat vitamin D  levels in 3 months.   A prescription has been sent to your pharmacy   Hemoglobin was within normal range for age.  No signs of anemia. Platelet count is elevated, appears to be chronic. We will continue to monitor

## 2024-09-19 ENCOUNTER — Other Ambulatory Visit: Payer: Self-pay | Admitting: Family Medicine

## 2024-09-19 DIAGNOSIS — G43109 Migraine with aura, not intractable, without status migrainosus: Secondary | ICD-10-CM

## 2024-09-19 DIAGNOSIS — F321 Major depressive disorder, single episode, moderate: Secondary | ICD-10-CM

## 2024-09-19 DIAGNOSIS — E559 Vitamin D deficiency, unspecified: Secondary | ICD-10-CM

## 2024-09-19 NOTE — Telephone Encounter (Signed)
 Copied from CRM [id number]. Topic: Clinical - Medication Refill >> Sep 19, 2024  4:39 PM Zebedee SAUNDERS wrote: Medication: QUEtiapine (SEROQUEL) 50 MG tablet, sertraline  (ZOLOFT ) 25 MG tablet,Vitamin D , Ergocalciferol , (DRISDOL ) 1.25 MG (50000 UNIT) CAPS capsule  Has the patient contacted their pharmacy? Yes (Agent: If no, request that the patient contact the pharmacy for the refill. If patient does not wish to contact the pharmacy document the reason why and proceed with request.) (Agent: If yes, when and what did the pharmacy advise?)Pt needs script.   This is the patient's preferred pharmacy:  [id number] - [place], [place] - [address] AT [facility] [address] [place] [address] [place] [address] Phone: [phone] Fax: [phone]  Is this the correct pharmacy for this prescription? Yes If no, delete pharmacy and type the correct one.   Has the prescription been filled recently? Yes  Is the patient out of the medication? Yes  Has the patient been seen for an appointment in the last year OR does the patient have an upcoming appointment? Yes  Can we respond through MyChart? Yes  Agent: Please be advised that Rx refills may take up to 3 business days. We ask that you follow-up with your pharmacy.

## 2024-09-20 NOTE — Telephone Encounter (Signed)
 Rx: sertraline - 10/25/23 #30 3RF        Vit D- 08/29/24 #12 Too soon Requested Prescriptions  Pending Prescriptions Disp Refills   QUEtiapine  (SEROQUEL ) 50 MG tablet      Sig: Take 1 tablet (50 mg total) by mouth daily.     Not Delegated - Psychiatry:  Antipsychotics - Second Generation (Atypical) - quetiapine  Failed - 09/20/2024  2:29 PM      Failed - This refill cannot be delegated      Failed - Lipid Panel in normal range within the last 12 months    Cholesterol, Total  Date Value Ref Range Status  08/23/2024 226 (H) 100 - 199 mg/dL Final   LDL Chol Calc (NIH)  Date Value Ref Range Status  08/23/2024 166 (H) 0 - 99 mg/dL Final   HDL  Date Value Ref Range Status  08/23/2024 46 >39 mg/dL Final   Triglycerides  Date Value Ref Range Status  08/23/2024 79 0 - 149 mg/dL Final         Failed - CBC within normal limits and completed in the last 12 months    WBC  Date Value Ref Range Status  08/23/2024 9.1 3.4 - 10.8 x10E3/uL Final  10/13/2014 17.5 (H) 3.6 - 11.0 x10 3/mm 3 Final  01/03/2014 8.7 4.0 - 10.5 K/uL Final   RBC  Date Value Ref Range Status  08/23/2024 4.65 3.77 - 5.28 x10E6/uL Final  10/13/2014 4.79 3.80 - 5.20 X10 6/mm 3 Final  01/03/2014 4.55 3.87 - 5.11 MIL/uL Final   Hemoglobin  Date Value Ref Range Status  08/23/2024 14.1 11.1 - 15.9 g/dL Final   Hematocrit  Date Value Ref Range Status  08/23/2024 43.6 34.0 - 46.6 % Final   MCHC  Date Value Ref Range Status  08/23/2024 32.3 31.5 - 35.7 g/dL Final  98/68/7983 65.6 32.0 - 36.0 g/dL Final  95/76/7984 65.6 30.0 - 36.0 g/dL Final   [facility]  Date Value Ref Range Status  08/23/2024 30.3 26.6 - 33.0 pg Final  10/13/2014 31.2 26.0 - 34.0 pg Final  01/03/2014 31.6 26.0 - 34.0 pg Final   MCV  Date Value Ref Range Status  08/23/2024 94 79 - 97 fL Final  10/13/2014 91 80 - 100 fL Final   No results found for: PLTCOUNTKUC, LABPLAT, POCPLA RDW  Date Value Ref Range Status  08/23/2024 13.5 11.7 - 15.4  % Final  10/13/2014 13.7 11.5 - 14.5 % Final         Failed - CMP within normal limits and completed in the last 12 months    Albumin  Date Value Ref Range Status  08/23/2024 4.6 3.9 - 4.9 g/dL Final  98/68/7983 4.2 3.4 - 5.0 g/dL Final   Alkaline Phosphatase  Date Value Ref Range Status  08/23/2024 90 41 - 116 IU/L Final  10/13/2014 78 46 - 116 Unit/L Final   ALT  Date Value Ref Range Status  08/23/2024 16 0 - 32 IU/L Final   SGPT (ALT)  Date Value Ref Range Status  10/13/2014 26 14 - 63 U/L Final   AST  Date Value Ref Range Status  08/23/2024 23 0 - 40 IU/L Final   SGOT(AST)  Date Value Ref Range Status  10/13/2014 26 15 - 37 Unit/L Final   BUN  Date Value Ref Range Status  08/23/2024 11 6 - 20 mg/dL Final  98/68/7983 11 7 - 18 mg/dL Final   Calcium  Date Value Ref Range Status  08/23/2024  9.8 8.7 - 10.2 mg/dL Final   Calcium, Total  Date Value Ref Range Status  10/13/2014 9.7 8.5 - 10.1 mg/dL Final   CO2  Date Value Ref Range Status  08/23/2024 22 20 - 29 mmol/L Final   Co2  Date Value Ref Range Status  10/13/2014 25 21 - 32 mmol/L Final   Creatinine  Date Value Ref Range Status  10/13/2014 0.82 0.60 - 1.30 mg/dL Final   Creatinine, Ser  Date Value Ref Range Status  08/23/2024 0.87 0.57 - 1.00 mg/dL Final   Glucose  Date Value Ref Range Status  08/23/2024 80 70 - 99 mg/dL Final  98/68/7983 894 (H) 65 - 99 mg/dL Final   Potassium  Date Value Ref Range Status  08/23/2024 4.1 3.5 - 5.2 mmol/L Final  10/13/2014 3.2 (L) 3.5 - 5.1 mmol/L Final   Sodium  Date Value Ref Range Status  08/23/2024 138 134 - 144 mmol/L Final  10/13/2014 137 136 - 145 mmol/L Final   Bilirubin,Total  Date Value Ref Range Status  10/13/2014 0.6 0.2 - 1.0 mg/dL Final   Bilirubin Total  Date Value Ref Range Status  08/23/2024 0.5 0.0 - 1.2 mg/dL Final   Protein, ur  Date Value Ref Range Status  12/12/2015 NEGATIVE NEGATIVE mg/dL Final   Total Protein  Date  Value Ref Range Status  08/23/2024 7.7 6.0 - 8.5 g/dL Final  98/68/7983 8.6 (H) 6.4 - 8.2 g/dL Final   EGFR (African American)  Date Value Ref Range Status  10/13/2014 >60 >27mL/min Final   eGFR  Date Value Ref Range Status  08/23/2024 89 >59 mL/min/1.73 Final   EGFR (Non-African Amer.)  Date Value Ref Range Status  10/13/2014 >60 >78mL/min Final    Comment:    eGFR values <34mL/min/1.73 m2 may be an indication of chronic kidney disease (CKD). Calculated eGFR, using the MRDR Study equation, is useful in  patients with stable renal function. The eGFR calculation will not be reliable in acutely ill patients when serum creatinine is changing rapidly. It is not useful in patients on dialysis. The eGFR calculation may not be applicable to patients at the low and high extremes of body sizes, pregnant women, and vegetarians.          Passed - TSH in normal range and within 360 days    TSH  Date Value Ref Range Status  08/23/2024 0.837 0.450 - 4.500 uIU/mL Final         Passed - Completed PHQ-2 or PHQ-9 in the last 360 days      Passed - Last BP in normal range    BP Readings from Last 1 Encounters:  08/23/24 132/78         Passed - Last Heart Rate in normal range    Pulse Readings from Last 1 Encounters:  08/23/24 72         Passed - Valid encounter within last 6 months    Recent Outpatient Visits           4 weeks ago Headache disorder   [place] [facility] Simmons-Robinson, [place], [place]               sertraline  (ZOLOFT ) 25 MG tablet 30 tablet 3    Sig: Take 1 tablet (25 mg total) by mouth daily.     Psychiatry:  Antidepressants - SSRI - sertraline  Passed - 09/20/2024  2:29 PM      Passed - AST in normal range and within 360  days    AST  Date Value Ref Range Status  08/23/2024 23 0 - 40 IU/L Final   SGOT(AST)  Date Value Ref Range Status  10/13/2014 26 15 - 37 Unit/L Final         Passed - ALT in normal range and within 360 days     ALT  Date Value Ref Range Status  08/23/2024 16 0 - 32 IU/L Final   SGPT (ALT)  Date Value Ref Range Status  10/13/2014 26 14 - 63 U/L Final         Passed - Completed PHQ-2 or PHQ-9 in the last 360 days      Passed - Valid encounter within last 6 months    Recent Outpatient Visits           4 weeks ago Headache disorder   [place] [facility] Simmons-Robinson, [place], MD               Vitamin D , Ergocalciferol , (DRISDOL ) 1.25 MG (50000 UNIT) CAPS capsule 12 capsule 0    Sig: Take 1 capsule (50,000 Units total) by mouth every 7 (seven) days.     Endocrinology:  Vitamins - Vitamin D  Supplementation 2 Failed - 09/20/2024  2:29 PM      Failed - Manual Review: Route requests for 50,000 IU strength to the provider      Failed - Vitamin D  in normal range and within 360 days    Vit D, 25-Hydroxy  Date Value Ref Range Status  08/23/2024 4.7 (L) 30.0 - 100.0 ng/mL Final    Comment:    Vitamin D  deficiency has been defined by the Institute of Medicine and an Endocrine Society practice guideline as a level of serum 25-OH vitamin D  less than 20 ng/mL (1,2). The Endocrine Society went on to further define vitamin D  insufficiency as a level between 21 and 29 ng/mL (2). 1. IOM (Institute of Medicine). 2010. Dietary reference    intakes for calcium and D. [place]: The    [employer]. 2. Holick MF, Binkley [place], Bischoff-Ferrari HA, et al.    Evaluation, treatment, and prevention of vitamin D     deficiency: an Endocrine Society clinical practice    guideline. JCEM. 2011 Jul; 96(7):1911-30.          Passed - Ca in normal range and within 360 days    Calcium  Date Value Ref Range Status  08/23/2024 9.8 8.7 - 10.2 mg/dL Final   Calcium, Total  Date Value Ref Range Status  10/13/2014 9.7 8.5 - 10.1 mg/dL Final         Passed - Valid encounter within last 12 months    Recent Outpatient Visits           4 weeks ago Headache disorder   [facility] Sharma Coyer, MD

## 2024-09-20 NOTE — Telephone Encounter (Signed)
 Requested medication (s) are due for refill today- unknown  Requested medication (s) are on the active medication list -yes  Future visit scheduled -yes  Last refill: unsure  Notes to clinic: listed as historical provider, non delegated Rx  Requested Prescriptions  Pending Prescriptions Disp Refills   QUEtiapine  (SEROQUEL ) 50 MG tablet      Sig: Take 1 tablet (50 mg total) by mouth daily.     Not Delegated - Psychiatry:  Antipsychotics - Second Generation (Atypical) - quetiapine  Failed - 09/20/2024  2:30 PM      Failed - This refill cannot be delegated      Failed - Lipid Panel in normal range within the last 12 months    Cholesterol, Total  Date Value Ref Range Status  08/23/2024 226 (H) 100 - 199 mg/dL Final   LDL Chol Calc (NIH)  Date Value Ref Range Status  08/23/2024 166 (H) 0 - 99 mg/dL Final   HDL  Date Value Ref Range Status  08/23/2024 46 >39 mg/dL Final   Triglycerides  Date Value Ref Range Status  08/23/2024 79 0 - 149 mg/dL Final         Failed - CBC within normal limits and completed in the last 12 months    WBC  Date Value Ref Range Status  08/23/2024 9.1 3.4 - 10.8 x10E3/uL Final  10/13/2014 17.5 (H) 3.6 - 11.0 x10 3/mm 3 Final  01/03/2014 8.7 4.0 - 10.5 K/uL Final   RBC  Date Value Ref Range Status  08/23/2024 4.65 3.77 - 5.28 x10E6/uL Final  10/13/2014 4.79 3.80 - 5.20 X10 6/mm 3 Final  01/03/2014 4.55 3.87 - 5.11 MIL/uL Final   Hemoglobin  Date Value Ref Range Status  08/23/2024 14.1 11.1 - 15.9 g/dL Final   Hematocrit  Date Value Ref Range Status  08/23/2024 43.6 34.0 - 46.6 % Final   MCHC  Date Value Ref Range Status  08/23/2024 32.3 31.5 - 35.7 g/dL Final  98/68/7983 65.6 32.0 - 36.0 g/dL Final  95/76/7984 65.6 30.0 - 36.0 g/dL Final   [facility]  Date Value Ref Range Status  08/23/2024 30.3 26.6 - 33.0 pg Final  10/13/2014 31.2 26.0 - 34.0 pg Final  01/03/2014 31.6 26.0 - 34.0 pg Final   MCV  Date Value Ref Range Status  08/23/2024  94 79 - 97 fL Final  10/13/2014 91 80 - 100 fL Final   No results found for: PLTCOUNTKUC, LABPLAT, POCPLA RDW  Date Value Ref Range Status  08/23/2024 13.5 11.7 - 15.4 % Final  10/13/2014 13.7 11.5 - 14.5 % Final         Failed - CMP within normal limits and completed in the last 12 months    Albumin  Date Value Ref Range Status  08/23/2024 4.6 3.9 - 4.9 g/dL Final  98/68/7983 4.2 3.4 - 5.0 g/dL Final   Alkaline Phosphatase  Date Value Ref Range Status  08/23/2024 90 41 - 116 IU/L Final  10/13/2014 78 46 - 116 Unit/L Final   ALT  Date Value Ref Range Status  08/23/2024 16 0 - 32 IU/L Final   SGPT (ALT)  Date Value Ref Range Status  10/13/2014 26 14 - 63 U/L Final   AST  Date Value Ref Range Status  08/23/2024 23 0 - 40 IU/L Final   SGOT(AST)  Date Value Ref Range Status  10/13/2014 26 15 - 37 Unit/L Final   BUN  Date Value Ref Range Status  08/23/2024 11 6 -  20 mg/dL Final  98/68/7983 11 7 - 18 mg/dL Final   Calcium  Date Value Ref Range Status  08/23/2024 9.8 8.7 - 10.2 mg/dL Final   Calcium, Total  Date Value Ref Range Status  10/13/2014 9.7 8.5 - 10.1 mg/dL Final   CO2  Date Value Ref Range Status  08/23/2024 22 20 - 29 mmol/L Final   Co2  Date Value Ref Range Status  10/13/2014 25 21 - 32 mmol/L Final   Creatinine  Date Value Ref Range Status  10/13/2014 0.82 0.60 - 1.30 mg/dL Final   Creatinine, Ser  Date Value Ref Range Status  08/23/2024 0.87 0.57 - 1.00 mg/dL Final   Glucose  Date Value Ref Range Status  08/23/2024 80 70 - 99 mg/dL Final  98/68/7983 894 (H) 65 - 99 mg/dL Final   Potassium  Date Value Ref Range Status  08/23/2024 4.1 3.5 - 5.2 mmol/L Final  10/13/2014 3.2 (L) 3.5 - 5.1 mmol/L Final   Sodium  Date Value Ref Range Status  08/23/2024 138 134 - 144 mmol/L Final  10/13/2014 137 136 - 145 mmol/L Final   Bilirubin,Total  Date Value Ref Range Status  10/13/2014 0.6 0.2 - 1.0 mg/dL Final   Bilirubin Total   Date Value Ref Range Status  08/23/2024 0.5 0.0 - 1.2 mg/dL Final   Protein, ur  Date Value Ref Range Status  12/12/2015 NEGATIVE NEGATIVE mg/dL Final   Total Protein  Date Value Ref Range Status  08/23/2024 7.7 6.0 - 8.5 g/dL Final  98/68/7983 8.6 (H) 6.4 - 8.2 g/dL Final   EGFR (African American)  Date Value Ref Range Status  10/13/2014 >60 >53mL/min Final   eGFR  Date Value Ref Range Status  08/23/2024 89 >59 mL/min/1.73 Final   EGFR (Non-African Amer.)  Date Value Ref Range Status  10/13/2014 >60 >23mL/min Final    Comment:    eGFR values <36mL/min/1.73 m2 may be an indication of chronic kidney disease (CKD). Calculated eGFR, using the MRDR Study equation, is useful in  patients with stable renal function. The eGFR calculation will not be reliable in acutely ill patients when serum creatinine is changing rapidly. It is not useful in patients on dialysis. The eGFR calculation may not be applicable to patients at the low and high extremes of body sizes, pregnant women, and vegetarians.          Passed - TSH in normal range and within 360 days    TSH  Date Value Ref Range Status  08/23/2024 0.837 0.450 - 4.500 uIU/mL Final         Passed - Completed PHQ-2 or PHQ-9 in the last 360 days      Passed - Last BP in normal range    BP Readings from Last 1 Encounters:  08/23/24 132/78         Passed - Last Heart Rate in normal range    Pulse Readings from Last 1 Encounters:  08/23/24 72         Passed - Valid encounter within last 6 months    Recent Outpatient Visits           4 weeks ago Headache disorder   [facility] [place], [place], [place]              Refused Prescriptions Disp Refills   sertraline  (ZOLOFT ) 25 MG tablet 30 tablet 3    Sig: Take 1 tablet (25 mg total) by mouth daily.  Psychiatry:  Antidepressants - SSRI - sertraline  Passed - 09/20/2024  2:30 PM      Passed - AST in normal range and within 360  days    AST  Date Value Ref Range Status  08/23/2024 23 0 - 40 IU/L Final   SGOT(AST)  Date Value Ref Range Status  10/13/2014 26 15 - 37 Unit/L Final         Passed - ALT in normal range and within 360 days    ALT  Date Value Ref Range Status  08/23/2024 16 0 - 32 IU/L Final   SGPT (ALT)  Date Value Ref Range Status  10/13/2014 26 14 - 63 U/L Final         Passed - Completed PHQ-2 or PHQ-9 in the last 360 days      Passed - Valid encounter within last 6 months    Recent Outpatient Visits           4 weeks ago Headache disorder   [facility] Simmons-Robinson, [place], [place]               Vitamin D , Ergocalciferol , (DRISDOL ) 1.25 MG (50000 UNIT) CAPS capsule 12 capsule 0    Sig: Take 1 capsule (50,000 Units total) by mouth every 7 (seven) days.     Endocrinology:  Vitamins - Vitamin D  Supplementation 2 Failed - 09/20/2024  2:30 PM      Failed - Manual Review: Route requests for 50,000 IU strength to the provider      Failed - Vitamin D  in normal range and within 360 days    Vit D, 25-Hydroxy  Date Value Ref Range Status  08/23/2024 4.7 (L) 30.0 - 100.0 ng/mL Final    Comment:    Vitamin D  deficiency has been defined by the Institute of Medicine and an Endocrine Society practice guideline as a level of serum 25-OH vitamin D  less than 20 ng/mL (1,2). The Endocrine Society went on to further define vitamin D  insufficiency as a level between 21 and 29 ng/mL (2). 1. IOM (Institute of Medicine). 2010. Dietary reference    intakes for calcium and D. [place]: The    [employer]. 2. Holick MF, Binkley [place], Bischoff-Ferrari HA, et al.    Evaluation, treatment, and prevention of vitamin D     deficiency: an Endocrine Society clinical practice    guideline. JCEM. 2011 Jul; 96(7):1911-30.          Passed - Ca in normal range and within 360 days    Calcium  Date Value Ref Range Status  08/23/2024 9.8 8.7 - 10.2 mg/dL Final    Calcium, Total  Date Value Ref Range Status  10/13/2014 9.7 8.5 - 10.1 mg/dL Final         Passed - Valid encounter within last 12 months    Recent Outpatient Visits           4 weeks ago Headache disorder   [facility] Simmons-Robinson, [place], [place]                 Requested Prescriptions  Pending Prescriptions Disp Refills   QUEtiapine  (SEROQUEL ) 50 MG tablet      Sig: Take 1 tablet (50 mg total) by mouth daily.     Not Delegated - Psychiatry:  Antipsychotics - Second Generation (Atypical) - quetiapine  Failed - 09/20/2024  2:30 PM      Failed - This refill cannot be delegated  Failed - Lipid Panel in normal range within the last 12 months    Cholesterol, Total  Date Value Ref Range Status  08/23/2024 226 (H) 100 - 199 mg/dL Final   LDL Chol Calc (NIH)  Date Value Ref Range Status  08/23/2024 166 (H) 0 - 99 mg/dL Final   HDL  Date Value Ref Range Status  08/23/2024 46 >39 mg/dL Final   Triglycerides  Date Value Ref Range Status  08/23/2024 79 0 - 149 mg/dL Final         Failed - CBC within normal limits and completed in the last 12 months    WBC  Date Value Ref Range Status  08/23/2024 9.1 3.4 - 10.8 x10E3/uL Final  10/13/2014 17.5 (H) 3.6 - 11.0 x10 3/mm 3 Final  01/03/2014 8.7 4.0 - 10.5 K/uL Final   RBC  Date Value Ref Range Status  08/23/2024 4.65 3.77 - 5.28 x10E6/uL Final  10/13/2014 4.79 3.80 - 5.20 X10 6/mm 3 Final  01/03/2014 4.55 3.87 - 5.11 MIL/uL Final   Hemoglobin  Date Value Ref Range Status  08/23/2024 14.1 11.1 - 15.9 g/dL Final   Hematocrit  Date Value Ref Range Status  08/23/2024 43.6 34.0 - 46.6 % Final   MCHC  Date Value Ref Range Status  08/23/2024 32.3 31.5 - 35.7 g/dL Final  98/68/7983 65.6 32.0 - 36.0 g/dL Final  95/76/7984 65.6 30.0 - 36.0 g/dL Final   [facility]  Date Value Ref Range Status  08/23/2024 30.3 26.6 - 33.0 pg Final  10/13/2014 31.2 26.0 - 34.0 pg Final  01/03/2014 31.6 26.0  - 34.0 pg Final   MCV  Date Value Ref Range Status  08/23/2024 94 79 - 97 fL Final  10/13/2014 91 80 - 100 fL Final   No results found for: PLTCOUNTKUC, LABPLAT, POCPLA RDW  Date Value Ref Range Status  08/23/2024 13.5 11.7 - 15.4 % Final  10/13/2014 13.7 11.5 - 14.5 % Final         Failed - CMP within normal limits and completed in the last 12 months    Albumin  Date Value Ref Range Status  08/23/2024 4.6 3.9 - 4.9 g/dL Final  98/68/7983 4.2 3.4 - 5.0 g/dL Final   Alkaline Phosphatase  Date Value Ref Range Status  08/23/2024 90 41 - 116 IU/L Final  10/13/2014 78 46 - 116 Unit/L Final   ALT  Date Value Ref Range Status  08/23/2024 16 0 - 32 IU/L Final   SGPT (ALT)  Date Value Ref Range Status  10/13/2014 26 14 - 63 U/L Final   AST  Date Value Ref Range Status  08/23/2024 23 0 - 40 IU/L Final   SGOT(AST)  Date Value Ref Range Status  10/13/2014 26 15 - 37 Unit/L Final   BUN  Date Value Ref Range Status  08/23/2024 11 6 - 20 mg/dL Final  98/68/7983 11 7 - 18 mg/dL Final   Calcium  Date Value Ref Range Status  08/23/2024 9.8 8.7 - 10.2 mg/dL Final   Calcium, Total  Date Value Ref Range Status  10/13/2014 9.7 8.5 - 10.1 mg/dL Final   CO2  Date Value Ref Range Status  08/23/2024 22 20 - 29 mmol/L Final   Co2  Date Value Ref Range Status  10/13/2014 25 21 - 32 mmol/L Final   Creatinine  Date Value Ref Range Status  10/13/2014 0.82 0.60 - 1.30 mg/dL Final   Creatinine, Ser  Date Value Ref Range Status  08/23/2024 0.87 0.57 - 1.00 mg/dL Final   Glucose  Date Value Ref Range Status  08/23/2024 80 70 - 99 mg/dL Final  98/68/7983 894 (H) 65 - 99 mg/dL Final   Potassium  Date Value Ref Range Status  08/23/2024 4.1 3.5 - 5.2 mmol/L Final  10/13/2014 3.2 (L) 3.5 - 5.1 mmol/L Final   Sodium  Date Value Ref Range Status  08/23/2024 138 134 - 144 mmol/L Final  10/13/2014 137 136 - 145 mmol/L Final   Bilirubin,Total  Date Value Ref Range  Status  10/13/2014 0.6 0.2 - 1.0 mg/dL Final   Bilirubin Total  Date Value Ref Range Status  08/23/2024 0.5 0.0 - 1.2 mg/dL Final   Protein, ur  Date Value Ref Range Status  12/12/2015 NEGATIVE NEGATIVE mg/dL Final   Total Protein  Date Value Ref Range Status  08/23/2024 7.7 6.0 - 8.5 g/dL Final  98/68/7983 8.6 (H) 6.4 - 8.2 g/dL Final   EGFR (African American)  Date Value Ref Range Status  10/13/2014 >60 >17mL/min Final   eGFR  Date Value Ref Range Status  08/23/2024 89 >59 mL/min/1.73 Final   EGFR (Non-African Amer.)  Date Value Ref Range Status  10/13/2014 >60 >9mL/min Final    Comment:    eGFR values <77mL/min/1.73 m2 may be an indication of chronic kidney disease (CKD). Calculated eGFR, using the MRDR Study equation, is useful in  patients with stable renal function. The eGFR calculation will not be reliable in acutely ill patients when serum creatinine is changing rapidly. It is not useful in patients on dialysis. The eGFR calculation may not be applicable to patients at the low and high extremes of body sizes, pregnant women, and vegetarians.          Passed - TSH in normal range and within 360 days    TSH  Date Value Ref Range Status  08/23/2024 0.837 0.450 - 4.500 uIU/mL Final         Passed - Completed PHQ-2 or PHQ-9 in the last 360 days      Passed - Last BP in normal range    BP Readings from Last 1 Encounters:  08/23/24 132/78         Passed - Last Heart Rate in normal range    Pulse Readings from Last 1 Encounters:  08/23/24 72         Passed - Valid encounter within last 6 months    Recent Outpatient Visits           4 weeks ago Headache disorder   [facility] [place], [place], [place]              Refused Prescriptions Disp Refills   sertraline  (ZOLOFT ) 25 MG tablet 30 tablet 3    Sig: Take 1 tablet (25 mg total) by mouth daily.     Psychiatry:  Antidepressants - SSRI - sertraline  Passed -  09/20/2024  2:30 PM      Passed - AST in normal range and within 360 days    AST  Date Value Ref Range Status  08/23/2024 23 0 - 40 IU/L Final   SGOT(AST)  Date Value Ref Range Status  10/13/2014 26 15 - 37 Unit/L Final         Passed - ALT in normal range and within 360 days    ALT  Date Value Ref Range Status  08/23/2024 16 0 - 32 IU/L Final   SGPT (ALT)  Date Value Ref  Range Status  10/13/2014 26 14 - 63 U/L Final         Passed - Completed PHQ-2 or PHQ-9 in the last 360 days      Passed - Valid encounter within last 6 months    Recent Outpatient Visits           4 weeks ago Headache disorder   [facility] Simmons-Robinson, [place], MD               Vitamin D , Ergocalciferol , (DRISDOL ) 1.25 MG (50000 UNIT) CAPS capsule 12 capsule 0    Sig: Take 1 capsule (50,000 Units total) by mouth every 7 (seven) days.     Endocrinology:  Vitamins - Vitamin D  Supplementation 2 Failed - 09/20/2024  2:30 PM      Failed - Manual Review: Route requests for 50,000 IU strength to the provider      Failed - Vitamin D  in normal range and within 360 days    Vit D, 25-Hydroxy  Date Value Ref Range Status  08/23/2024 4.7 (L) 30.0 - 100.0 ng/mL Final    Comment:    Vitamin D  deficiency has been defined by the Institute of Medicine and an Endocrine Society practice guideline as a level of serum 25-OH vitamin D  less than 20 ng/mL (1,2). The Endocrine Society went on to further define vitamin D  insufficiency as a level between 21 and 29 ng/mL (2). 1. IOM (Institute of Medicine). 2010. Dietary reference    intakes for calcium and D. [place]: The    [employer]. 2. Holick MF, Binkley [place], Bischoff-Ferrari HA, et al.    Evaluation, treatment, and prevention of vitamin D     deficiency: an Endocrine Society clinical practice    guideline. JCEM. 2011 Jul; 96(7):1911-30.          Passed - Ca in normal range and within 360 days    Calcium  Date  Value Ref Range Status  08/23/2024 9.8 8.7 - 10.2 mg/dL Final   Calcium, Total  Date Value Ref Range Status  10/13/2014 9.7 8.5 - 10.1 mg/dL Final         Passed - Valid encounter within last 12 months    Recent Outpatient Visits           4 weeks ago Headache disorder   [facility] Sharma Coyer, MD

## 2024-09-23 MED ORDER — SERTRALINE HCL 25 MG PO TABS: 25.0000 mg | ORAL_TABLET | Freq: Every day | ORAL | 2 refills | Status: AC

## 2024-09-23 MED ORDER — QUETIAPINE FUMARATE 50 MG PO TABS: 50.0000 mg | ORAL_TABLET | Freq: Every day | ORAL | 2 refills | Status: AC

## 2024-09-23 MED ORDER — VITAMIN D (ERGOCALCIFEROL) 1.25 MG (50000 UNIT) PO CAPS: 50000.0000 [IU] | ORAL_CAPSULE | ORAL | 1 refills | Status: AC

## 2024-11-21 ENCOUNTER — Ambulatory Visit: Admitting: Family Medicine

## 2024-12-03 ENCOUNTER — Ambulatory Visit: Admitting: Family Medicine
